# Patient Record
Sex: Female | Born: 1965 | Race: White | Hispanic: No | Marital: Single | Smoking: Former smoker
Health system: Southern US, Community
[De-identification: ages and names within clinical notes are randomized; demographics above are authoritative.]

## PROBLEM LIST (undated history)

## (undated) DIAGNOSIS — M1990 Unspecified osteoarthritis, unspecified site: Secondary | ICD-10-CM

## (undated) DIAGNOSIS — M069 Rheumatoid arthritis, unspecified: Secondary | ICD-10-CM

## (undated) DIAGNOSIS — L4050 Arthropathic psoriasis, unspecified: Secondary | ICD-10-CM

## (undated) DIAGNOSIS — I10 Essential (primary) hypertension: Secondary | ICD-10-CM

## (undated) DIAGNOSIS — U071 COVID-19: Secondary | ICD-10-CM

## (undated) DIAGNOSIS — E119 Type 2 diabetes mellitus without complications: Secondary | ICD-10-CM

## (undated) DIAGNOSIS — Z720 Tobacco use: Secondary | ICD-10-CM

## (undated) DIAGNOSIS — E785 Hyperlipidemia, unspecified: Secondary | ICD-10-CM

## (undated) HISTORY — DX: COVID-19: U07.1

## (undated) HISTORY — DX: Tobacco use: Z72.0

## (undated) HISTORY — PX: WISDOM TOOTH EXTRACTION: SHX21

## (undated) HISTORY — DX: Rheumatoid arthritis, unspecified: M06.9

## (undated) HISTORY — DX: Arthropathic psoriasis, unspecified: L40.50

## (undated) HISTORY — DX: Unspecified osteoarthritis, unspecified site: M19.90

## (undated) HISTORY — DX: Hyperlipidemia, unspecified: E78.5

## (undated) HISTORY — DX: Essential (primary) hypertension: I10

---

## 2004-09-05 ENCOUNTER — Ambulatory Visit: Payer: Self-pay | Admitting: Obstetrics and Gynecology

## 2007-01-21 ENCOUNTER — Ambulatory Visit: Payer: Self-pay | Admitting: Obstetrics and Gynecology

## 2007-01-26 ENCOUNTER — Ambulatory Visit: Payer: Self-pay | Admitting: Obstetrics and Gynecology

## 2008-02-08 ENCOUNTER — Ambulatory Visit: Payer: Self-pay | Admitting: Obstetrics and Gynecology

## 2009-02-08 ENCOUNTER — Ambulatory Visit: Payer: Self-pay | Admitting: Obstetrics and Gynecology

## 2009-02-16 ENCOUNTER — Ambulatory Visit: Payer: Self-pay | Admitting: Obstetrics and Gynecology

## 2010-02-19 ENCOUNTER — Ambulatory Visit: Payer: Self-pay | Admitting: Obstetrics and Gynecology

## 2010-12-11 ENCOUNTER — Encounter: Payer: Self-pay | Admitting: Family Medicine

## 2010-12-11 ENCOUNTER — Ambulatory Visit (INDEPENDENT_AMBULATORY_CARE_PROVIDER_SITE_OTHER): Payer: 59 | Admitting: Family Medicine

## 2010-12-11 DIAGNOSIS — R5383 Other fatigue: Secondary | ICD-10-CM

## 2010-12-11 DIAGNOSIS — Z136 Encounter for screening for cardiovascular disorders: Secondary | ICD-10-CM

## 2010-12-11 DIAGNOSIS — Z0000 Encounter for general adult medical examination without abnormal findings: Secondary | ICD-10-CM | POA: Insufficient documentation

## 2010-12-11 NOTE — Progress Notes (Signed)
45 yo female here to establish care.  Tobacco abuse- cut back to 8-10 cigs per day.  Heavy smoker, has smoked 1-2 ppd since she was a teenager. Quit once two years ago with Chantix for 3 months. Has rx to restart chantix at home, not quite ready.  G0, engaged to be married. Has OBGYN, Dr. Madelin Headings.  At times feels a little fatigued. No CP or SOB. Recently started walking more, trying to get in shape with her fiance.  The PMH, PSH, Social History, Family History, Medications, and allergies have been reviewed in [facility], and have been updated if relevant.  ROS: General: Denies fever, chills, sweats. No significant weight loss. Eyes: Denies blurring,significant itching ENT: Denies earache, sore throat, and hoarseness.  Cardiovascular: Denies chest pains, palpitations, dyspnea on exertion,  Respiratory: Denies cough, dyspnea at rest,wheeezing Breast: no concerns about lumps GI: Denies nausea, vomiting, diarrhea, constipation, change in bowel habits, abdominal pain, melena, hematochezia GU: Denies dysuria, hematuria, urinary hesitancy, nocturia, denies STD risk, no concerns about discharge Musculoskeletal: Denies back pain, joint pain Derm: Denies rash, itching Neuro: Denies  paresthesias, frequent falls, frequent headaches Psych: Denies depression, anxiety Endocrine: Denies cold intolerance, heat intolerance, polydipsia Heme: Denies enlarged lymph nodes Allergy: No hayfever  Physical exam: BP 110/80  Pulse 72  Temp(Src) 98.6 F (37 C) (Oral)  Ht 5\' 7"  (1.702 m)  Wt 199 lb 6.4 oz (90.447 kg)  BMI 31.23 kg/m2  LMP 11/20/2010  General:  Well-developed,well-nourished,in no acute distress; alert,appropriate and cooperative throughout examination Head:  normocephalic and atraumatic.   Eyes:  vision grossly intact, pupils equal, pupils round, and pupils reactive to light.   Ears:  R ear normal and L ear normal.   Nose:  no external deformity.   Mouth:  good dentition.   Neck:  No  deformities, masses, or tenderness noted. Lungs:  Normal respiratory effort, chest expands symmetrically. Lungs are clear to auscultation, no crackles or wheezes. Heart:  Normal rate and regular rhythm. S1 and S2 normal without gallop, murmur, click, rub or other extra sounds. Abdomen:  Bowel sounds positive,abdomen soft and non-tender without masses, organomegaly or hernias noted. Msk:  No deformity or scoliosis noted of thoracic or lumbar spine.   Extremities:  No clubbing, cyanosis, edema, or deformity noted with normal full range of motion of all joints.   Neurologic:  alert & oriented X3 and gait normal.   Skin:  Intact without suspicious lesions or rashes Cervical Nodes:  No lymphadenopathy noted Axillary Nodes:  No palpable lymphadenopathy Psych:  Cognition and judgment appear intact. Alert and cooperative with normal attention span and concentration. No apparent delusions, illusions, hallucinations

## 2010-12-11 NOTE — Assessment & Plan Note (Signed)
Reviewed preventive care protocols, scheduled due services, and updated immunizations Discussed nutrition, exercise, diet, and healthy lifestyle.  Orders Placed This Encounter  Procedures  . Basic Metabolic Panel (BMET)  . Lipid panel  . CBC w/Diff  . TSH

## 2010-12-18 ENCOUNTER — Encounter: Payer: Self-pay | Admitting: Family Medicine

## 2011-04-16 ENCOUNTER — Ambulatory Visit: Payer: Self-pay | Admitting: Obstetrics and Gynecology

## 2012-04-20 ENCOUNTER — Ambulatory Visit: Payer: Self-pay | Admitting: Obstetrics and Gynecology

## 2012-08-19 ENCOUNTER — Ambulatory Visit (INDEPENDENT_AMBULATORY_CARE_PROVIDER_SITE_OTHER): Payer: 59 | Admitting: Family Medicine

## 2012-08-19 ENCOUNTER — Encounter: Payer: Self-pay | Admitting: Family Medicine

## 2012-08-19 VITALS — BP 120/72 | HR 85 | Temp 98.9°F | Ht 67.0 in | Wt 189.5 lb

## 2012-08-19 DIAGNOSIS — R209 Unspecified disturbances of skin sensation: Secondary | ICD-10-CM

## 2012-08-19 DIAGNOSIS — M2550 Pain in unspecified joint: Secondary | ICD-10-CM

## 2012-08-19 DIAGNOSIS — M79609 Pain in unspecified limb: Secondary | ICD-10-CM

## 2012-08-19 DIAGNOSIS — M659 Unspecified synovitis and tenosynovitis, unspecified site: Secondary | ICD-10-CM

## 2012-08-19 DIAGNOSIS — R202 Paresthesia of skin: Secondary | ICD-10-CM

## 2012-08-19 DIAGNOSIS — M79605 Pain in left leg: Secondary | ICD-10-CM

## 2012-08-19 NOTE — Patient Instructions (Addendum)
REFERRAL: GO THE THE FRONT ROOM AT THE ENTRANCE OF OUR CLINIC, NEAR CHECK IN. ASK FOR MARION. SHE WILL HELP YOU SET UP YOUR REFERRAL. DATE: TIME:  

## 2012-08-19 NOTE — Progress Notes (Signed)
Nature conservation officer at [facility] [address] [place] [address] Phone: [phone] Fax: [phone]  Date:  08/19/2012   Name:  Regina Barnes   DOB:  02-21-1966   MRN:  [id number] Gender: female Age: 46 y.o.  PCP:  Ruthe Mannan, MD  Evaluating MD: Hannah Beat, MD   Chief Complaint: Arm Pain and Leg Pain   History of Present Illness:  Regina Barnes is a 46 y.o. pleasant patient who presents with the following:  Generally healthy 46 year old patient, female, who presents with 3 weeks of worsening multiple joint polyarthralgia, some swelling, and pain in her hands bilaterally, neck pain, knee pain as well as generalized achiness, more in her legs. She's also had some swelling in her LEFT knee and pain and fullness there anteriorly and posteriorly.  Within the last few days she's also developed some pain in her RIGHT shoulder as well.  She also has some 1st MCP mild redness and swelling and tenderness that is all new for her.  All this has been ongoing for 3 weeks, and she initially went to her chiropractor, Dr. Chriss Czar, and no manipulation or chiropractic intervention as helped at all. She has had some neck pain however. He is scheduled to see her on Monday with x-rays prior.  She initially went to the walk-in clinic at [facility], and was given a five-day prednisone taper as well as tramadol. This helped a little bit, but she remains symptomatic and is actually been worsening over time significantly. She is here with her fianc who is quite concerned.  There is no history of trauma or accident.  08/06/2012 - also felt something on the lateral aspect of her right leg, and then the next day, felt ok, felt some achiness in her office in her legs. Yesterday, had some achiness in the right leg, and then about six at night, then had sme left knee pain. Very tender and the knee feels swollen.   Now having pain in the right side of her shoulder.   No Rheum Fam History.     Patient Active Problem List  Diagnosis  . Routine general medical examination at a health care facility    Past Medical History  Diagnosis Date  . Tobacco abuse     No past surgical history on file.  History  Substance Use Topics  . Smoking status: Current Every Day Smoker  . Smokeless tobacco: Not on file  . Alcohol Use: Not on file    Family History  Problem Relation Age of Onset  . Heart attack Father 27    No Known Allergies  Medication list has been reviewed and updated.  No outpatient prescriptions prior to visit.    Last reviewed on 08/19/2012  3:13 PM by Consuello Masse, CMA  Review of Systems:  No fever, chills, sweats, nausea system, chest pain, shortness of breath. No headache. No blurred vision. No neurological signs or symptoms or changes. Multiple joint pains and complaints as above.  Physical Examination: Filed Vitals:   08/19/12 1426  BP: 120/72  Pulse: 85  Temp: 98.9 F (37.2 C)  TempSrc: Oral  Height: 5\' 7"  (1.702 m)  Weight: 189 lb 8 oz (85.957 kg)  SpO2: 97%    Body mass index is 29.68 kg/(m^2). Ideal Body Weight: Weight in (lb) to have BMI = 25: 159.3    GEN: WDWN, NAD, Non-toxic, Alert & Oriented x 3 HEENT: Atraumatic, Normocephalic.  Ears and Nose: No external deformity. EXTR: No clubbing/cyanosis/edema  NEURO: moves very slowly and stands very slowly from a seated position. There is no focal weakness and no proximal weakness whatsoever. Strength testing is 5/5 on examination. PSYCH: Normally interactive. Conversant. Not depressed or anxious appearing.  Calm demeanor.   Neck range of motion is mildly restricted with an approximate 15% loss of motion in multiple directions. There is no inducible Spurling's maneuver. Paracervical musculature is tender.  Bilateral slow movement of shoulders with full range of motion. Minimally tender to palpation at the a.c. Joint. Nontender at the clavicle. Mildly tender in the bicipital groove.  Strength testing is 5/5. A.c. Crossovers minimally positive. Leanord Asal testing and Neer testing are positive. Speech test is positive. Yergason's test is negative. No significant grinding.  Bilateral hands with no significant swelling or bogginess in the PIP or DIP joints. MCP joint on the 1st digit is swollen, tender and reddish, more on the LEFT.  Axial loading is nontender, but there is some bogginess in the true wrist joints bilaterally.  Hip range of motion is excellent and full bilaterally.  LEFT knee with a minimal to mild effusion. Slightly greater so compared to the RIGHT. Full extension. Flexion to 115. Apprehensive with manipulation of the knee. Stable to varus and valgus stress. Negative Lachman maneuver. Negative posterior drawer testing. Bounce home testing is painful. No mechanical symptoms with McMurray's testing, but it is painful. The patient does guard somewhat.  Homen's positive. Tender with posterior palpation and squeezing of the calf musculature on L.  Objective Data: All verbally reviewed with the patient on the telephone by myself. Hannah Beat, MD   Results for orders placed in visit on 08/19/12  HIGH SENSITIVITY CRP      Component Value Range   CRP, High Sensitivity 79.79 (*) 0.00 - 3.00 mg/L  SEDIMENTATION RATE      Component Value Range   Sed Rate 17  0 - 32 mm/hr  ANA      Component Value Range   ANA Positive (*) Negative  RHEUMATOID FACTOR      Component Value Range   Rheumatoid Factor 18.4 (*) 0.0 - 13.9 IU/mL  CYCLIC CITRUL PEPTIDE ANTIBODY, IGG      Component Value Range   Cyclic Citrullin Peptide Ab 5.7 (*) 0.0 - 5.0 U/mL  BASIC METABOLIC PANEL      Component Value Range   Glucose 116 (*) 65 - 99 mg/dL   BUN 12  6 - 24 mg/dL   Creatinine, Ser 1.61  0.57 - 1.00 mg/dL   GFR calc non Af Amer 91  >59 mL/min/1.73   GFR calc Af Amer 105  >59 mL/min/1.73   BUN/Creatinine Ratio 15  9 - 23   Sodium 138  134 - 144 mmol/L   Potassium 4.1  3.5  - 5.2 mmol/L   Chloride 100  97 - 108 mmol/L   CO2 22  19 - 28 mmol/L   Calcium 9.4  8.7 - 10.2 mg/dL  CBC WITH DIFFERENTIAL      Component Value Range   WBC 16.4 (*) 3.4 - 10.8 x10E3/uL   RBC 4.34  3.77 - 5.28 x10E6/uL   Hemoglobin 13.2  11.1 - 15.9 g/dL   HCT 09.6  04.5 - 40.9 %   MCV 90  79 - 97 fL   MCH 30.4  26.6 - 33.0 pg   MCHC 33.9  31.5 - 35.7 g/dL   RDW 81.1  91.4 - 78.2 %   Neutrophils Relative 70  40 - 74 %  Lymphs 21  14 - 46 %   Monocytes 8  4 - 12 %   Eos 1  0 - 5 %   Basos 0  0 - 3 %   Neutrophils Absolute 11.5 (*) 1.4 - 7.0 x10E3/uL   Lymphocytes Absolute 3.4 (*) 0.7 - 3.1 x10E3/uL   Monocytes Absolute 1.3 (*) 0.1 - 0.9 x10E3/uL   Eosinophils Absolute 0.1  0.0 - 0.4 x10E3/uL   Basophils Absolute 0.0  0.0 - 0.2 x10E3/uL   Immature Granulocytes 0  0 - 2 %   Immature Grans (Abs) 0.0  0.0 - 0.1 x10E3/uL  HEPATIC FUNCTION PANEL      Component Value Range   Total Protein 6.3  6.0 - 8.5 g/dL   Albumin 4.1  3.5 - 5.5 g/dL   Total Bilirubin 0.4  0.0 - 1.2 mg/dL   Bilirubin, Direct 4.09  0.00 - 0.40 mg/dL   Alkaline Phosphatase 93  39 - 117 IU/L   AST 11  0 - 40 IU/L   ALT 16  0 - 32 IU/L  VITAMIN B12      Component Value Range   Vitamin B-12 456  211 - 946 pg/mL    US Venous Img Lower Unilateral Left  08/20/2012  *RADIOLOGY REPORT*  Clinical Data: History of left leg pain.  LEFT LOWER EXTREMITY VENOUS DUPLEX ULTRASOUND  Technique:  Gray-scale sonography with graded compression, as well as color Doppler and duplex ultrasound were performed to evaluate the deep venous system of the lower extremity from the level of the common femoral vein through the popliteal and proximal calf veins. Spectral Doppler was utilized to evaluate flow at rest and with distal augmentation maneuvers.  Comparison:  None.  Findings:  Normal compressibility of the common femoral, superficial femoral, and popliteal veins is demonstrated, as well as the visualized proximal calf veins.  No filling  defects to suggest DVT on grayscale or color Doppler imaging.  Doppler waveforms show normal direction of venous flow, normal respiratory phasicity and response to augmentation.  No superficial thrombophlebitis was identified.  Multiple varicosities are present.  There is some reflux at the saphenous femoral junction.  Popliteal fluid collection consistent with popliteal synovial Baker's cyst is seen.  This measures 4.9 x 2.9 x 1.3 cm.  IMPRESSION: No evidence of deep venous thrombosis involving the left leg.  Multiple superficial varicosities are present.  No superficial phlebothrombosis was evident.  Popliteal fluid collection consistent with popliteal synovial Baker's cyst is seen.  This measures 4.9 x 2.9 x 1.3 cm.   Original Report Authenticated By: Onalee Hua Call     Assessment and Plan:  1. Polyarthralgia  High sensitivity CRP, Sedimentation rate, ANA, Rheumatoid factor, Cyclic citrul peptide antibody, IgG, Basic metabolic panel, CBC with Differential, Hepatic function panel, Ambulatory referral to Rheumatology  2. Left leg pain  US Venous Img Lower Unilateral Left, Ambulatory referral to Rheumatology  3. Tingling in extremities  Basic metabolic panel, CBC with Differential, Hepatic function panel, Vitamin B12  4. Synovitis  Ambulatory referral to Rheumatology   >40 minutes spent in face to face time with patient, >50% spent in counselling or coordination of care: history and examination are not consistent with occult orthopedic pathology.  Significant left-sided posterior knee pain and pain with palpation and squeezing of the calf musculature. Obtain a Doppler ultrasound to rule out potential deep vein thrombosis. Ultrasound has returned and is negative. There is a Baker's cyst which may or may not be contributing to the patient's  knee pain. Discussed with her that these can be associated with meniscal pathology, but taking care of probable rheumatological condition 1st with reassessment makes  greatest sense.  Elevated CRP at 80. Positive rheumatoid factor, positive CCP anti bodies and positive ANA. Given clinical picture and laboratory picture and polyarthralgia with some synovitis and effusion, high level of clinical concern for rheumatological disease and active rheumatological flare.  All discussed with patient, and will consult Dr. Lavenia Atlas for his expertise. Appreciate consult.  Orders Today:  Orders Placed This Encounter  Procedures  . US Venous Img Lower Unilateral Left    Standing Status: Future     Number of Occurrences: 1     Standing Expiration Date: 10/19/2013    ATTN LEFT LEG**WT-189/NO PREV/NO NEEDS PER OFFICE/EPIC ORDER/[facility]    Order Specific Question:  Reason for exam:    Answer:  left posterior LE pain, eval for DVT    Order Specific Question:  Preferred imaging location?    Answer:  [facility]  . High sensitivity CRP  . Sedimentation rate  . ANA  . Rheumatoid factor  . Cyclic citrul peptide antibody, IgG  . Basic metabolic panel  . CBC with Differential  . Hepatic function panel  . Vitamin B12  . Ambulatory referral to Rheumatology    Referral Priority:  Routine    Referral Type:  Consultation    Referral Reason:  Specialty Services Required    Requested Specialty:  Rheumatology    Number of Visits Requested:  1    Updated Medication List: (Includes new medications, updates to list, dose adjustments) Meds ordered this encounter  Medications  . traMADol (ULTRAM) 50 MG tablet    Sig: Take 50 mg by mouth every 8 (eight) hours as needed.     Medications Discontinued: There are no discontinued medications.   Hannah Beat, MD

## 2012-08-20 ENCOUNTER — Ambulatory Visit
Admission: RE | Admit: 2012-08-20 | Discharge: 2012-08-20 | Disposition: A | Discharge status: Home / Self Care | Payer: 59 | Source: Ambulatory Visit | Attending: Family Medicine | Admitting: Family Medicine

## 2012-08-20 DIAGNOSIS — M79605 Pain in left leg: Secondary | ICD-10-CM

## 2012-08-20 LAB — CBC WITH DIFFERENTIAL/PLATELET
Basos: 0 % (ref 0–3)
Eosinophils Absolute: 0.1 10*3/uL (ref 0.0–0.4)
Immature Grans (Abs): 0 10*3/uL (ref 0.0–0.1)
Immature Granulocytes: 0 % (ref 0–2)
Lymphs: 21 % (ref 14–46)
MCH: 30.4 pg (ref 26.6–33.0)
MCV: 90 fL (ref 79–97)
Monocytes Absolute: 1.3 10*3/uL — ABNORMAL HIGH (ref 0.1–0.9)
Neutrophils Relative %: 70 % (ref 40–74)
RBC: 4.34 x10E6/uL (ref 3.77–5.28)
RDW: 12.9 % (ref 12.3–15.4)
WBC: 16.4 10*3/uL — ABNORMAL HIGH (ref 3.4–10.8)

## 2012-08-20 LAB — HEPATIC FUNCTION PANEL
ALT: 16 IU/L (ref 0–32)
Total Protein: 6.3 g/dL (ref 6.0–8.5)

## 2012-08-20 LAB — BASIC METABOLIC PANEL
BUN: 12 mg/dL (ref 6–24)
CO2: 22 mmol/L (ref 19–28)
Chloride: 100 mmol/L (ref 97–108)
GFR calc Af Amer: 105 mL/min/{1.73_m2} (ref >59)
Glucose: 116 mg/dL — ABNORMAL HIGH (ref 65–99)
Potassium: 4.1 mmol/L (ref 3.5–5.2)

## 2012-08-20 LAB — RHEUMATOID FACTOR: Rhuematoid fact SerPl-aCnc: 18.4 IU/mL — ABNORMAL HIGH (ref 0.0–13.9)

## 2012-08-20 LAB — HIGH SENSITIVITY CRP: CRP, High Sensitivity: 79.79 mg/L — ABNORMAL HIGH (ref 0.00–3.00)

## 2012-08-20 LAB — VITAMIN B12: Vitamin B-12: 456 pg/mL (ref 211–946)

## 2012-09-14 ENCOUNTER — Telehealth: Payer: Self-pay | Admitting: Family Medicine

## 2012-09-14 DIAGNOSIS — R768 Other specified abnormal immunological findings in serum: Secondary | ICD-10-CM

## 2012-09-14 NOTE — Telephone Encounter (Signed)
Dr. Patsy Lager saw this pt last week.  She would like referral to [facility] to see a rheumatologist.  She would like to see Dr Dareen Piano or Dr Dierdre Forth.  Office # [phone] fax# [phone].

## 2012-09-14 NOTE — Telephone Encounter (Signed)
Referral placed.

## 2012-09-14 NOTE — Telephone Encounter (Signed)
Advised patient

## 2012-12-01 ENCOUNTER — Encounter: Payer: Self-pay | Admitting: Family Medicine

## 2012-12-01 ENCOUNTER — Ambulatory Visit (INDEPENDENT_AMBULATORY_CARE_PROVIDER_SITE_OTHER): Payer: 59 | Admitting: Family Medicine

## 2012-12-01 VITALS — BP 102/76 | HR 68 | Temp 97.9°F | Wt 191.8 lb

## 2012-12-01 DIAGNOSIS — M2550 Pain in unspecified joint: Secondary | ICD-10-CM

## 2012-12-01 NOTE — Addendum Note (Signed)
Addended by: Alvina Chou on: 12/01/2012 10:42 AM   Modules accepted: Orders

## 2012-12-01 NOTE — Progress Notes (Signed)
  Subjective:    Patient ID: Regina Barnes, female    DOB: [birth year], 47 y.o.   MRN: [id number]  HPI  Very pleasant 47 yo female here to discuss her inflammatory arthritis.  Seeing Dr. Dareen Piano, most recently saw him yesterday.  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  On 11/02/2012, started sulindac 150 mg daily.  No real improvement.  This was d/c'd on 11/15/2012 and prednisone 10 mg daily was started. Symptoms have improved a little- some days better than others.  She was advised to stop her prednisone today and start Celebrex this evening. He wants her to follow up with him in 1 week concerning how celebrex is working.   If it it ineffective, he would like for her to consider MTX.  Most of pain is in her bilateral arms and shoulders.  Both her grandfather and aunt had psoraisis.  She has no skin lesions.  Patient Active Problem List  Diagnosis  . Routine general medical examination at a health care facility  . Polyarthralgia   Past Medical History  Diagnosis Date  . Tobacco abuse    No past surgical history on file. History  Substance Use Topics  . Smoking status: Former Games developer  . Smokeless tobacco: Not on file  . Alcohol Use: Not on file   Family History  Problem Relation Age of Onset  . Heart attack Father 32   Allergies  Allergen Reactions  . Codeine Rash   No current outpatient prescriptions on file prior to visit.   No current facility-administered medications on file prior to visit.   The PMH, PSH, Social History, Family History, Medications, and allergies have been reviewed in [facility], and have been updated if relevant.   Review of Systems See HPI    Objective:   Physical Exam BP 102/76  Pulse 68  Temp(Src) 97.9 F (36.6 C) (Oral)  Wt 191 lb 12 oz (86.977 kg)  BMI 30.03 kg/m2  SpO2 96%  LMP 11/17/2012  General:  Well-developed,well-nourished,in no acute distress; alert,appropriate and cooperative throughout examination Head:   normocephalic and atraumatic.   Lungs:  Normal respiratory effort, chest expands symmetrically. Lungs are clear to auscultation, no crackles or wheezes. Heart:  Normal rate and regular rhythm. S1 and S2 normal without gallop, murmur, click, rub or other extra sounds. Abdomen:  Bowel sounds positive,abdomen soft and non-tender without masses, organomegaly or hernias noted. Msk:  No deformity or scoliosis noted of thoracic or lumbar spine.   Extremities:  No clubbing, cyanosis, edema, or deformity noted with normal full range of motion of all joints.   Neurologic:  alert & oriented X3 and gait normal.   Skin:  Intact without suspicious lesions or rashes Psych:  Cognition and judgment appear intact. Alert and cooperative with normal attention span and concentration. No apparent delusions, illusions, hallucinations     Assessment & Plan:  1. Polyarthralgia Inflammatory arthritis. >25 min spent with face to face with patient, >50% counseling and/or coordinating care  She would like to be tested for Lyme disease as she read that it can mimic inflammatory arthritis.  No known tick bite or travel to [place].   - B. Burgdorfi Antibodies - Comprehensive metabolic panel

## 2012-12-01 NOTE — Patient Instructions (Addendum)
It was great to see you, Regina Barnes. We will call you with your lab results.  Keep me posted and hang in there.

## 2012-12-02 LAB — COMPREHENSIVE METABOLIC PANEL
Albumin/Globulin Ratio: 2.2 (ref 1.1–2.5)
Albumin: 4.3 g/dL (ref 3.5–5.5)
Alkaline Phosphatase: 95 IU/L (ref 39–117)
BUN/Creatinine Ratio: 14 (ref 9–23)
BUN: 12 mg/dL (ref 6–24)
CO2: 25 mmol/L (ref 19–28)
Calcium: 9.7 mg/dL (ref 8.7–10.2)
Creatinine, Ser: 0.83 mg/dL (ref 0.57–1.00)
GFR calc non Af Amer: 85 mL/min/{1.73_m2} (ref >59)
Globulin, Total: 2 g/dL (ref 1.5–4.5)
Sodium: 141 mmol/L (ref 134–144)

## 2012-12-03 ENCOUNTER — Encounter: Payer: Self-pay | Admitting: *Deleted

## 2013-03-01 ENCOUNTER — Encounter: Payer: Self-pay | Admitting: Family Medicine

## 2013-03-01 ENCOUNTER — Ambulatory Visit (INDEPENDENT_AMBULATORY_CARE_PROVIDER_SITE_OTHER): Payer: 59 | Admitting: Family Medicine

## 2013-03-01 VITALS — BP 92/64 | HR 88 | Temp 98.1°F | Wt 200.0 lb

## 2013-03-01 DIAGNOSIS — M2550 Pain in unspecified joint: Secondary | ICD-10-CM

## 2013-03-01 NOTE — Progress Notes (Signed)
  Subjective:    Patient ID: Regina Barnes, female    DOB: 1965/11/09, 47 y.o.   MRN: [id number]  HPI  Very pleasant 47 yo female here to discuss her inflammatory arthritis.  Seeing Dr. Dareen Piano, most recently saw him 02/10/2013.  He feels she likely has ankylosing spondylitis.  Currently on Methotrexate- started on May 7th.  Has follow up with Dr. Dareen Piano on 05/12/2013.  Most of pain is in her hands and wrists, bilateral.  Feels better.  CBC two weeks ago normal.  Patient Active Problem List   Diagnosis Date Noted  . Polyarthralgia 12/01/2012  . Routine general medical examination at a health care facility 12/11/2010   Past Medical History  Diagnosis Date  . Tobacco abuse    No past surgical history on file. History  Substance Use Topics  . Smoking status: Former Games developer  . Smokeless tobacco: Not on file  . Alcohol Use: Not on file   Family History  Problem Relation Age of Onset  . Heart attack Father 67   Allergies  Allergen Reactions  . Codeine Rash   No current outpatient prescriptions on file prior to visit.   No current facility-administered medications on file prior to visit.   The PMH, PSH, Social History, Family History, Medications, and allergies have been reviewed in [facility], and have been updated if relevant.   Review of Systems See HPI    Objective:   Physical Exam BP 92/64  Pulse 88  Temp(Src) 98.1 F (36.7 C)  Wt 200 lb (90.719 kg)  BMI 31.32 kg/m2  General:  Well-developed,well-nourished,in no acute distress; alert,appropriate and cooperative throughout examination Head:  normocephalic and atraumatic.   Lungs:  Normal respiratory effort, chest expands symmetrically. Lungs are clear to auscultation, no crackles or wheezes. Heart:  Normal rate and regular rhythm. S1 and S2 normal without gallop, murmur, click, rub or other extra sounds. Abdomen:  Bowel sounds positive,abdomen soft and non-tender without masses, organomegaly or hernias noted. Msk:   No deformity or scoliosis noted of thoracic or lumbar spine.   Extremities:  No clubbing, cyanosis, edema, or deformity noted with normal full range of motion of all joints.   Neurologic:  alert & oriented X3 and gait normal.   Skin:  Intact without suspicious lesions or rashes Psych:  Cognition and judgment appear intact. Alert and cooperative with normal attention span and concentration. No apparent delusions, illusions, hallucinations     Assessment & Plan:  1. Polyarthralgia >15 min spent with face to face with patient, >50% counseling and/or coordinating care. She had some questions about her condition and MTX which we discussed today. She will keep f/u scheduled with Dr. Dareen Piano in 04/2013.

## 2013-05-04 ENCOUNTER — Ambulatory Visit: Payer: Self-pay | Admitting: Obstetrics and Gynecology

## 2014-07-10 IMAGING — US US EXTREM LOW VENOUS*L*
1 series · 13 of 24 positions shown · non-contrast
Comparison: None.

CLINICAL DATA: History of left leg pain.

LEFT LOWER EXTREMITY VENOUS DUPLEX ULTRASOUND
TECHNIQUE: Gray-scale sonography with graded compression, as well
as color Doppler and duplex ultrasound were performed to evaluate
the deep venous system of the lower extremity from the level of the
common femoral vein through the popliteal and proximal calf veins.
Spectral Doppler was utilized to evaluate flow at rest and with
distal augmentation maneuvers.

[Series 1: us extrem low venous*left* · 13 of 37 slices shown]
[im 1/37]
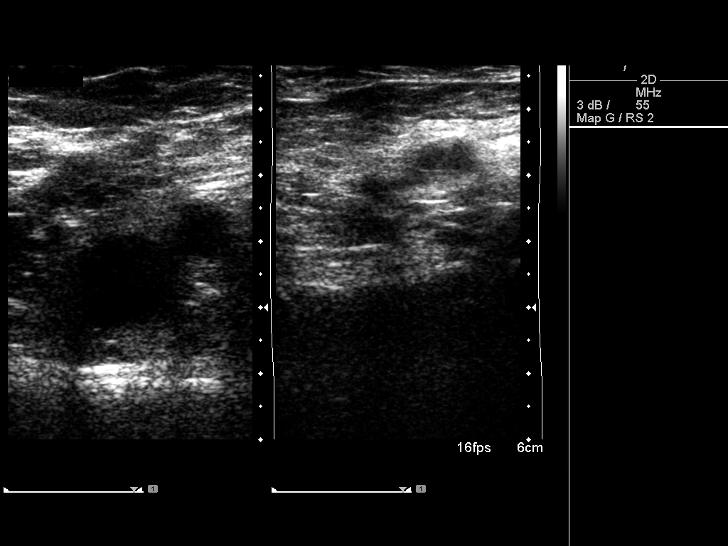
[im 4/37]
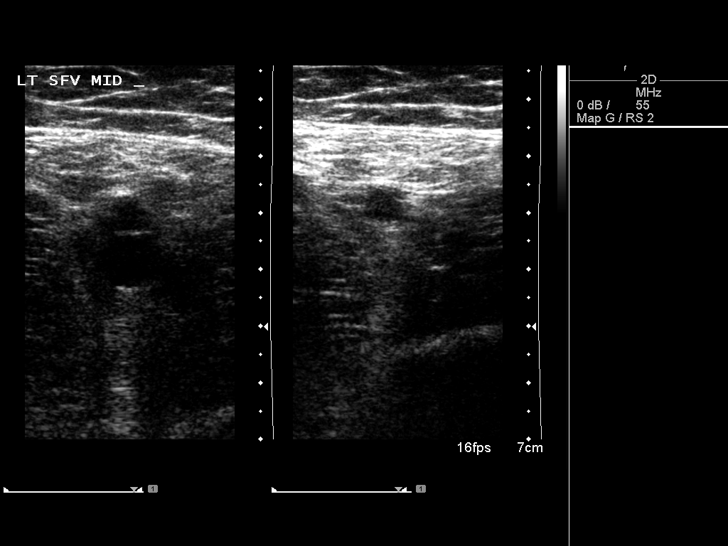
[im 7/37]
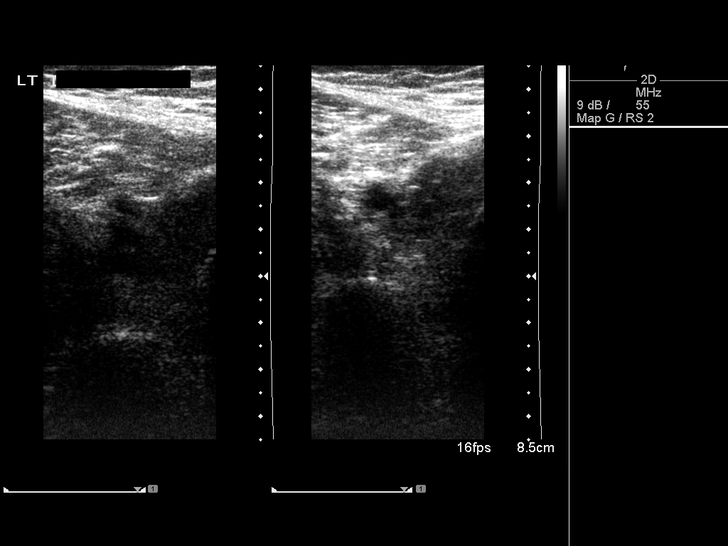
[im 10/37]
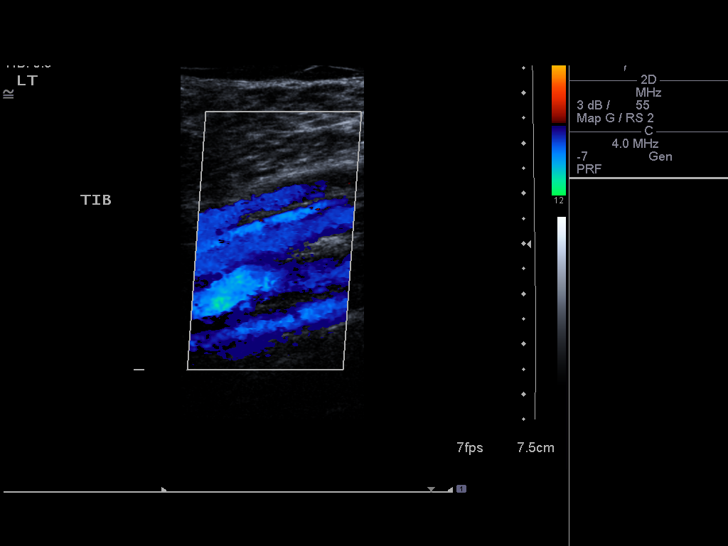
[im 13/37]
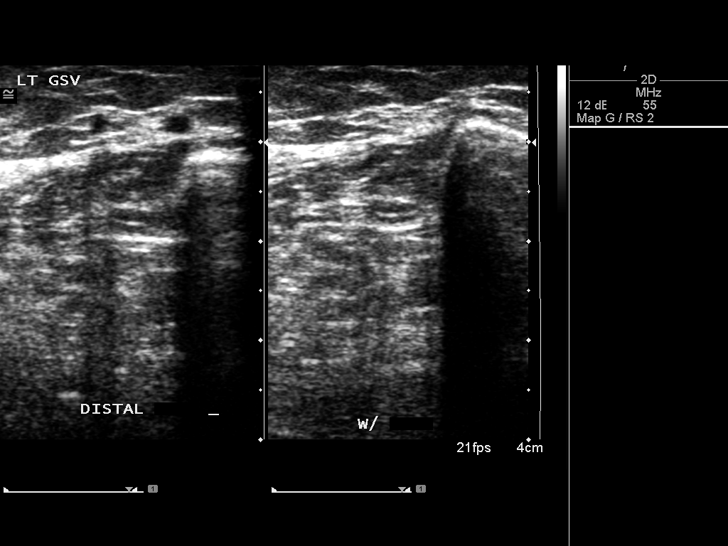
[im 16/37]
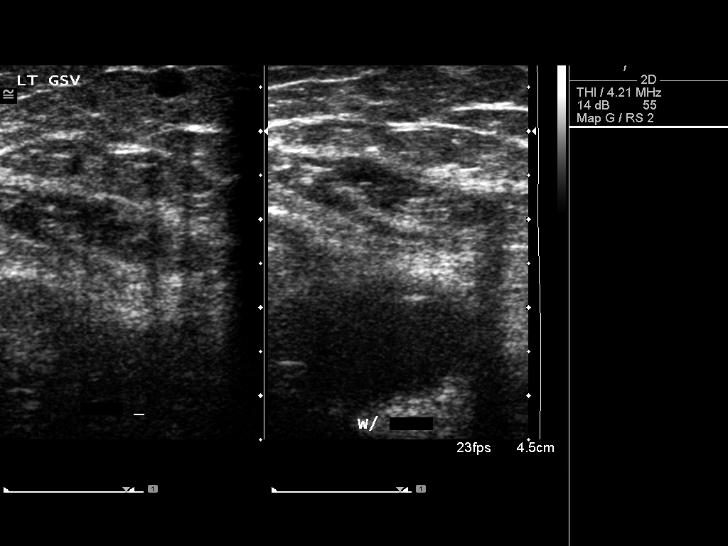
[im 19/37]
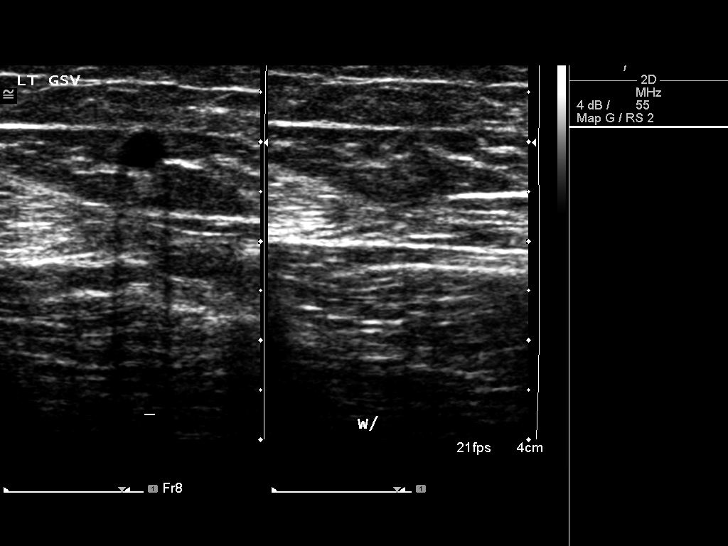
[im 21/37]
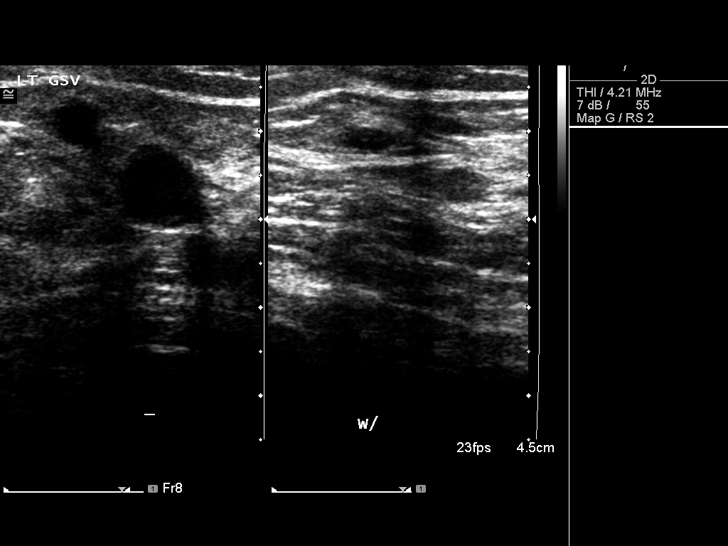
[im 24/37]
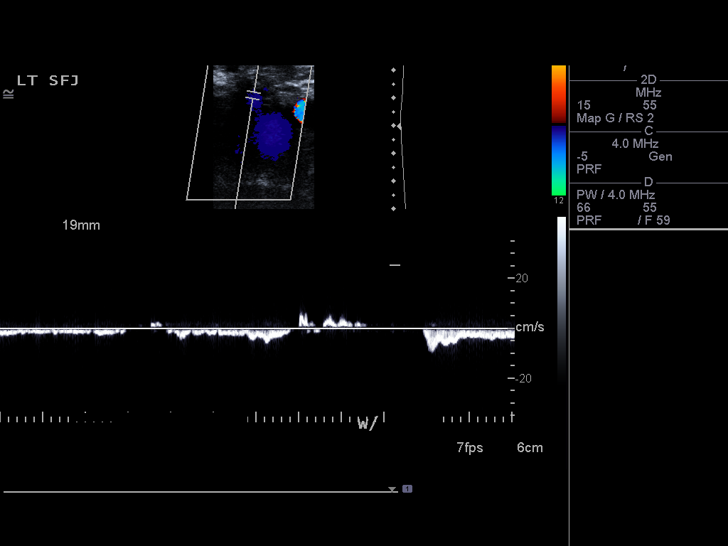
[im 27/37]
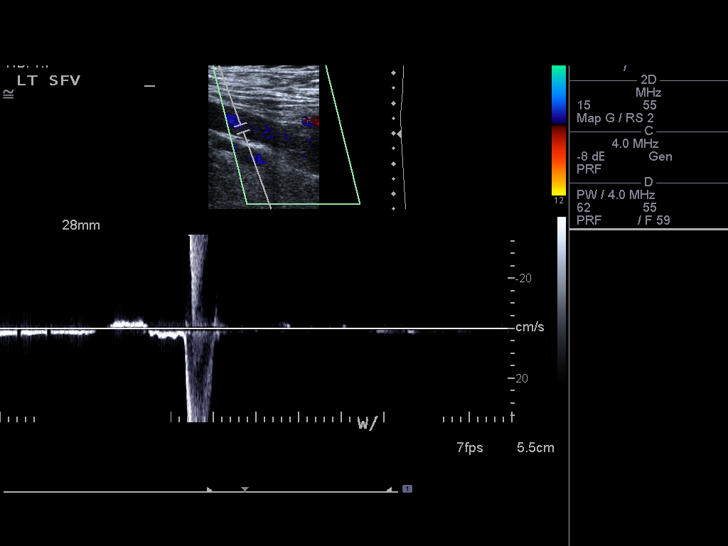
[im 30/37]
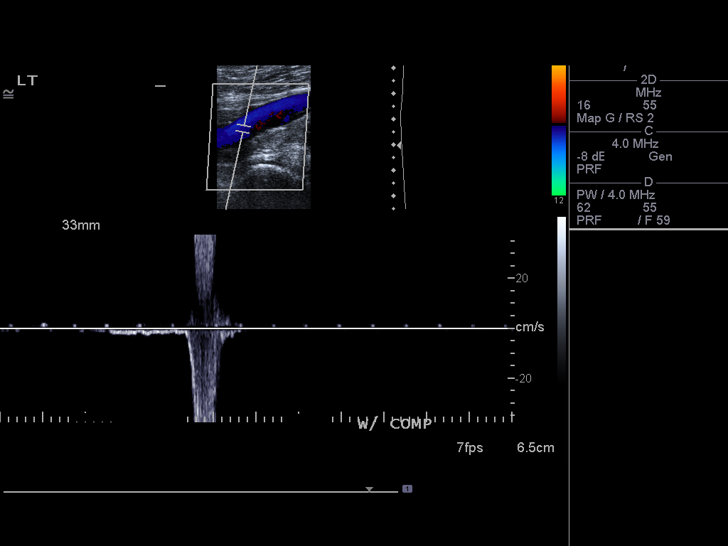
[im 33/37]
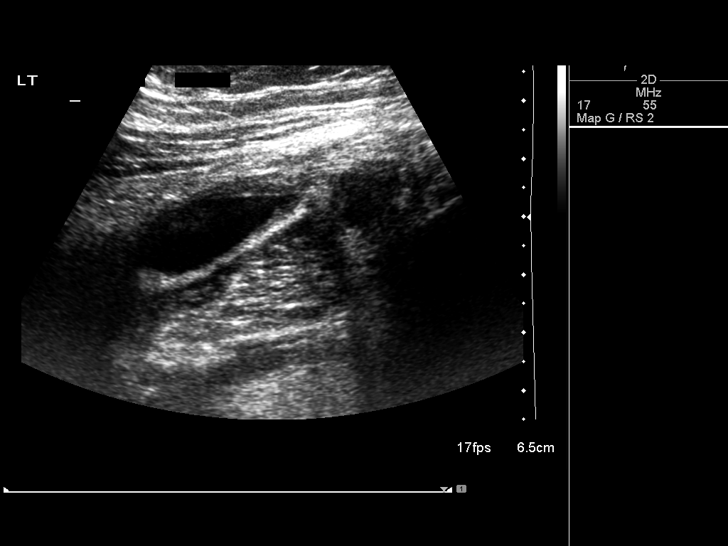
[im 37/37]
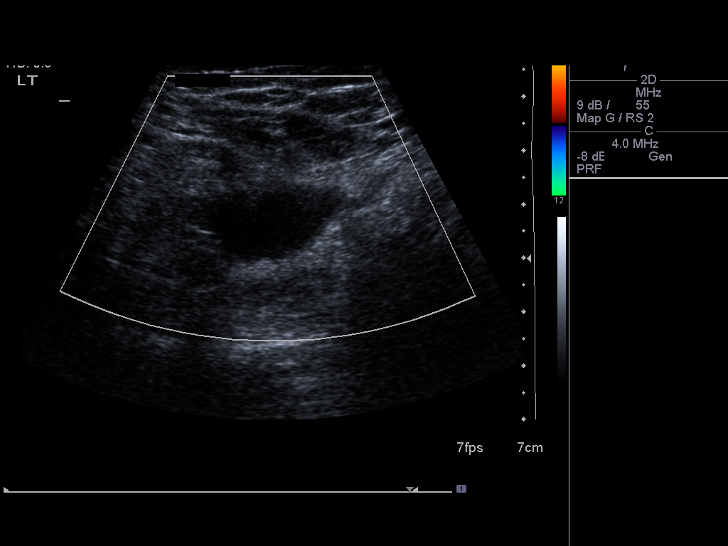

[13 of 24 positions shown; findings below may reference images not displayed]

FINDINGS: Normal compressibility of the common femoral,
superficial femoral, and popliteal veins is demonstrated, as well
as the visualized proximal calf veins.  No filling defects to
suggest DVT on grayscale or color Doppler imaging.  Doppler
waveforms show normal direction of venous flow, normal respiratory
phasicity and response to augmentation.

No superficial thrombophlebitis was identified.  Multiple
varicosities are present.  There is some reflux at the saphenous
femoral junction.  Popliteal fluid collection consistent with
popliteal synovial Baker's cyst is seen.  This measures 4.9 x 2.9 x
1.3 cm.
IMPRESSION: No evidence of deep venous thrombosis involving the left leg.

Multiple superficial varicosities are present.  No superficial
phlebothrombosis was evident.

Popliteal fluid collection consistent with popliteal synovial
Baker's cyst is seen.  This measures 4.9 x 2.9 x 1.3 cm.

## 2015-05-14 ENCOUNTER — Other Ambulatory Visit: Payer: Self-pay | Admitting: Obstetrics and Gynecology

## 2015-05-14 DIAGNOSIS — Z1231 Encounter for screening mammogram for malignant neoplasm of breast: Secondary | ICD-10-CM

## 2015-05-18 ENCOUNTER — Ambulatory Visit
Admission: RE | Admit: 2015-05-18 | Discharge: 2015-05-18 | Disposition: A | Discharge status: Home / Self Care | Payer: 59 | Source: Ambulatory Visit | Attending: Obstetrics and Gynecology | Admitting: Obstetrics and Gynecology

## 2015-05-18 DIAGNOSIS — Z1231 Encounter for screening mammogram for malignant neoplasm of breast: Secondary | ICD-10-CM | POA: Diagnosis not present

## 2015-06-12 ENCOUNTER — Ambulatory Visit (INDEPENDENT_AMBULATORY_CARE_PROVIDER_SITE_OTHER): Payer: 59 | Admitting: Family Medicine

## 2015-06-12 ENCOUNTER — Encounter: Payer: Self-pay | Admitting: Family Medicine

## 2015-06-12 VITALS — BP 104/80 | HR 71 | Temp 98.1°F | Wt 193.0 lb

## 2015-06-12 DIAGNOSIS — E119 Type 2 diabetes mellitus without complications: Secondary | ICD-10-CM

## 2015-06-12 MED ORDER — ADALIMUMAB 10 MG/0.2ML ~~LOC~~ PSKT: PREFILLED_SYRINGE | SUBCUTANEOUS | Status: DC

## 2015-06-12 MED ORDER — METFORMIN HCL 500 MG PO TABS: 500.0000 mg | ORAL_TABLET | Freq: Every day | ORAL | Status: DC

## 2015-06-12 NOTE — Patient Instructions (Signed)
Great to see you. We are starting Metformin 500 mg daily with breakfast. I will call you with your lab results.  Please stop by to see Regina Barnes on your way out.  Go ahead a follow up in 3 months.

## 2015-06-12 NOTE — Progress Notes (Signed)
   Subjective:   Patient ID: Regina Barnes, female    DOB: 1966/06/28, 49 y.o.   MRN: [id number]  Regina Barnes is a pleasant 49 y.o. year old female who presents to clinic today with Other  on 06/12/2015  HPI:  Brings in labs from work dated 9/6- a1c 6.6.  Denies no increased thirst or urination.  Has not been on prednisone recently.  No family h/o diabetes.  Has been trying to cut back on carbs although she admits "not that successfully."  She does not exercise regularly.    Current Outpatient Prescriptions on File Prior to Visit  Medication Sig Dispense Refill  . folic acid (FOLVITE) 1 MG tablet Take 1 mg by mouth daily.    . Methotrexate, PF, 25 MG/0.4ML SOAJ One injection weekly     No current facility-administered medications on file prior to visit.    Allergies  Allergen Reactions  . Codeine Rash    Past Medical History  Diagnosis Date  . Tobacco abuse     No past surgical history on file.  Family History  Problem Relation Age of Onset  . Heart attack Father 19  . Breast cancer Maternal Aunt     great aunt  . Breast cancer Paternal Aunt     great aunt    Social History   Social History  . Marital Status: Single    Spouse Name: N/A  . Number of Children: 0  . 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   Social History Main Topics  . Smoking status: Former Research scientist (life sciences)  . Smokeless tobacco: Not on file  . Alcohol Use: Not on file  . Drug Use: Not on file  . Sexual Activity: Not on file   Other Topics Concern  . Not on file   Social History Narrative   The PMH, PSH, Social History, Family History, Medications, and allergies have been reviewed in [facility], and have been updated if relevant.    Review of Systems  Constitutional: Negative.   Respiratory: Negative.   Cardiovascular: Negative.   Gastrointestinal: Negative.   Endocrine: Negative.   Genitourinary: Negative.   Musculoskeletal: Negative.   Skin:  Negative.   Neurological: Negative.   Hematological: Negative.   Psychiatric/Behavioral: Negative.   All other systems reviewed and are negative.      Objective:    BP 104/80 mmHg  Pulse 71  Temp(Src) 98.1 F (36.7 C) (Oral)  Wt 193 lb (87.544 kg)  SpO2 97%   Physical Exam  Constitutional: She is oriented to person, place, and time. She appears well-developed and well-nourished. No distress.  HENT:  Head: Normocephalic.  Eyes: Conjunctivae are normal.  Cardiovascular: Normal rate.   Pulmonary/Chest: Effort normal.  Musculoskeletal: Normal range of motion.  Neurological: She is alert and oriented to person, place, and time. No cranial nerve deficit.  Skin: Skin is warm and dry.  Psychiatric: She has a normal mood and affect. Her behavior is normal. Judgment and thought content normal.  Nursing note and vitals reviewed.         Assessment & Plan:   Diabetes mellitus, new onset ([place]) - Plan: Hemoglobin A1c, Comprehensive metabolic panel, Ambulatory referral to diabetic education No Follow-up on file.

## 2015-06-12 NOTE — Progress Notes (Signed)
Pre visit review using our clinic review tool, if applicable. No additional management support is needed unless otherwise documented below in the visit note. 

## 2015-06-12 NOTE — Assessment & Plan Note (Signed)
New- >25 minutes spent in face to face time with patient, >50% spent in counselling or coordination of care Advised rechecking labs today.  Start Metformin 500 mg daily, refer to diabetic nutritionist and follow up in 3 months. The patient indicates understanding of these issues and agrees with the plan.  Orders Placed This Encounter  Procedures  . Hemoglobin A1c  . Comprehensive metabolic panel  . Lipid panel  . Ambulatory referral to diabetic education

## 2015-06-13 LAB — COMPREHENSIVE METABOLIC PANEL
A/G RATIO: 1.9 (ref 1.1–2.5)
ALT: 21 IU/L (ref 0–32)
AST: 18 IU/L (ref 0–40)
Albumin: 4.5 g/dL (ref 3.5–5.5)
Alkaline Phosphatase: 104 IU/L (ref 39–117)
BUN/Creatinine Ratio: 10 (ref 9–23)
BUN: 9 mg/dL (ref 6–24)
Bilirubin Total: 0.2 mg/dL (ref 0.0–1.2)
CALCIUM: 9.6 mg/dL (ref 8.7–10.2)
CO2: 23 mmol/L (ref 18–29)
Chloride: 100 mmol/L (ref 97–106)
Creatinine, Ser: 0.87 mg/dL (ref 0.57–1.00)
GFR, EST AFRICAN AMERICAN: 91 mL/min/{1.73_m2} (ref >59)
GFR, EST NON AFRICAN AMERICAN: 79 mL/min/{1.73_m2} (ref >59)
Globulin, Total: 2.4 g/dL (ref 1.5–4.5)
Glucose: 150 mg/dL — ABNORMAL HIGH (ref 65–99)
POTASSIUM: 4.1 mmol/L (ref 3.5–5.2)
Sodium: 143 mmol/L (ref 136–144)
TOTAL PROTEIN: 6.9 g/dL (ref 6.0–8.5)

## 2015-06-13 LAB — HEMOGLOBIN A1C
ESTIMATED AVERAGE GLUCOSE: 137 mg/dL
Hgb A1c MFr Bld: 6.4 % — ABNORMAL HIGH (ref 4.8–5.6)

## 2015-06-13 LAB — LIPID PANEL
Chol/HDL Ratio: 3.4 ratio units (ref 0.0–4.4)
Cholesterol, Total: 234 mg/dL — ABNORMAL HIGH (ref 100–199)
HDL: 68 mg/dL (ref >39)
LDL Calculated: 138 mg/dL — ABNORMAL HIGH (ref 0–99)
Triglycerides: 140 mg/dL (ref 0–149)
VLDL Cholesterol Cal: 28 mg/dL (ref 5–40)

## 2015-06-20 ENCOUNTER — Encounter: Payer: 59 | Attending: Family Medicine | Admitting: *Deleted

## 2015-06-20 ENCOUNTER — Encounter: Payer: Self-pay | Admitting: *Deleted

## 2015-06-20 VITALS — BP 114/80 | Ht 67.0 in | Wt 192.3 lb

## 2015-06-20 DIAGNOSIS — E119 Type 2 diabetes mellitus without complications: Secondary | ICD-10-CM | POA: Insufficient documentation

## 2015-06-20 NOTE — Patient Instructions (Addendum)
Check blood sugars 2 x day before breakfast and 2 hrs after supper 3 x week Exercise: Continue walking for    30  minutes   3  days a week  gradually increase to 30 minutes 5 x week Avoid sugar sweetened drinks (soda, coffee) Eat 3 meals day,  1-2 snacks a day Space meals 4-6 hours apart Bring blood sugar records to the next class Call your doctor for a prescription for:  1. Meter strips (type) One Touch Ultra Blue  checking  3-4 times per week  2. Lancets (type) One Touch Delica  checking  3-4   times per week

## 2015-06-20 NOTE — Progress Notes (Signed)
Diabetes Self-Management Education  Visit Type: First/Initial  Appt. Start Time: 1035 Appt. End Time: 1150  06/20/2015  Regina Barnes, identified by name and date of birth, is a 49 y.o. female with a diagnosis of Diabetes: Type 2.   ASSESSMENT  Blood pressure 114/80, height 5\' 7"  (1.702 m), weight 192 lb 4.8 oz (87.227 kg), last menstrual period 05/20/2015. Body mass index is 30.11 kg/(m^2).      Diabetes Self-Management Education - 06/20/15 1301    Visit Information   Visit Type First/Initial   Initial Visit   Diabetes Type Type 2   Are you currently following a meal plan? No   Are you taking your medications as prescribed? Yes   Date Diagnosed 1 month ago   Health Coping   How would you rate your overall health? Good   Psychosocial Assessment   Patient Belief/Attitude about Diabetes Other (comment)  "not sure"   Self-care barriers None   Self-management support Doctor's office;Family   Patient Concerns Nutrition/Meal planning;Medication;Monitoring;Healthy Lifestyle;Glycemic Control;Weight Control   Special Needs None   Preferred Learning Style Visual   Learning Readiness Ready   How often do you need to have someone help you when you read instructions, pamphlets, or other written materials from your doctor or pharmacy? 1 - Never   What is the last grade level you completed in school? college   Complications   Last HgB A1C per patient/outside source 6.6 %  05/01/15   How often do you check your blood sugar? 0 times/day (not testing)  Provided One Touch Ultra 2 meter and instructed on use. BG upon return demonstration was 112 mg/dL at 11:40 am - 2 hrs pp.   Have you had a dilated eye exam in the past 12 months? Yes   Have you had a dental exam in the past 12 months? Yes   Are you checking your feet? No   Dietary Intake   Breakfast smoothie with 1 cup milk, apple, berries, flax seed, almonds   Lunch salad with chicken on sandiwich   Dinner meat and veggies; eats fish  2 x week; broccolli, rice, occasional hot dogs   Beverage(s) sugar with coffee, water, occasional regular soda   Exercise   Exercise Type Light (walking / raking leaves)   How many days per week to you exercise? 3   How many minutes per day do you exercise? 30   Total minutes per week of exercise 90   Patient Education   Previous Diabetes Education No   Disease state  Definition of diabetes, type 1 and 2, and the diagnosis of diabetes;Factors that contribute to the development of diabetes;Explored patient's options for treatment of their diabetes   Nutrition management  Role of diet in the treatment of diabetes and the relationship between the three main macronutrients and blood glucose level;Carbohydrate counting   Physical activity and exercise  Role of exercise on diabetes management, blood pressure control and cardiac health.   Medications Reviewed patients medication for diabetes, action, purpose, timing of dose and side effects.   Monitoring Taught/evaluated SMBG meter.;Purpose and frequency of SMBG.;Identified appropriate SMBG and/or A1C goals.   Chronic complications Relationship between chronic complications and blood glucose control   Psychosocial adjustment Identified and addressed patients feelings and concerns about diabetes   Individualized Goals (developed by patient)   Reducing Risk Improve blood sugars Decrease medications Prevent diabetes complications Lose weight Lead a healthier lifestyle Become more fit   Outcomes   Expected Outcomes Demonstrated interest  in learning. Expect positive outcomes      Individualized Plan for Diabetes Self-Management Training:   Learning Objective:  Patient will have a greater understanding of diabetes self-management. Patient education plan is to attend individual and/or group sessions per assessed needs and concerns.   Plan:   Patient Instructions  Check blood sugars 2 x day before breakfast and 2 hrs after supper 3 x  week Exercise: Continue walking for    30  minutes   3  days a week  gradually increase to 30 minutes 5 x week Avoid sugar sweetened drinks (soda, coffee) Eat 3 meals day,  1-2 snacks a day Space meals 4-6 hours apart Bring blood sugar records to the next class Call your doctor for a prescription for:  1. Meter strips (type) One Touch Ultra Blue  checking  3-4 times per week  2. Lancets (type) One Touch Delica  checking  3-4   times per week   Expected Outcomes:  Demonstrated interest in learning. Expect positive outcomes  Education material provided: General Meal Planning Guidelines Meter - One Touch Ultra 2  If problems or questions, patient to contact team via:  Johny Drilling, [place], [place], CDE [phone]  Future DSME appointment:  Monday July 23, 2015 for Class 1

## 2015-06-22 ENCOUNTER — Encounter: Payer: Self-pay | Admitting: Family Medicine

## 2015-06-22 MED ORDER — GLUCOSE BLOOD VI STRP: ORAL_STRIP | Status: DC

## 2015-06-22 NOTE — Addendum Note (Signed)
Addended by: Modena Nunnery on: 06/22/2015 10:15 AM   Modules accepted: Orders

## 2015-08-29 ENCOUNTER — Other Ambulatory Visit: Payer: Self-pay | Admitting: *Deleted

## 2015-08-29 MED ORDER — METFORMIN HCL 500 MG PO TABS: 500.0000 mg | ORAL_TABLET | Freq: Every day | ORAL | Status: DC

## 2015-08-29 NOTE — Telephone Encounter (Signed)
Pt recently switched to mail order pharmacy

## 2015-08-29 NOTE — Addendum Note (Signed)
Addended by: Modena Nunnery on: 08/29/2015 10:19 AM   Modules accepted: Orders

## 2015-09-04 ENCOUNTER — Encounter: Payer: 59 | Attending: Family Medicine | Admitting: Dietician

## 2015-09-04 ENCOUNTER — Encounter: Payer: Self-pay | Admitting: Dietician

## 2015-09-04 VITALS — Ht 67.0 in | Wt 194.4 lb

## 2015-09-04 DIAGNOSIS — E119 Type 2 diabetes mellitus without complications: Secondary | ICD-10-CM | POA: Diagnosis present

## 2015-09-04 NOTE — Progress Notes (Signed)

## 2015-09-10 ENCOUNTER — Encounter: Payer: 59 | Admitting: *Deleted

## 2015-09-10 ENCOUNTER — Encounter: Payer: Self-pay | Admitting: *Deleted

## 2015-09-10 VITALS — Wt 194.1 lb

## 2015-09-10 DIAGNOSIS — E119 Type 2 diabetes mellitus without complications: Secondary | ICD-10-CM | POA: Diagnosis not present

## 2015-09-10 NOTE — Progress Notes (Signed)

## 2015-09-12 ENCOUNTER — Ambulatory Visit (INDEPENDENT_AMBULATORY_CARE_PROVIDER_SITE_OTHER): Payer: 59 | Admitting: Family Medicine

## 2015-09-12 ENCOUNTER — Encounter: Payer: Self-pay | Admitting: Family Medicine

## 2015-09-12 VITALS — BP 108/74 | HR 88 | Temp 98.2°F | Wt 190.8 lb

## 2015-09-12 DIAGNOSIS — E119 Type 2 diabetes mellitus without complications: Secondary | ICD-10-CM

## 2015-09-12 MED ORDER — METFORMIN HCL 500 MG PO TABS: 500.0000 mg | ORAL_TABLET | Freq: Every day | ORAL | Status: DC

## 2015-09-12 NOTE — Progress Notes (Signed)
Pre visit review using our clinic review tool, if applicable. No additional management support is needed unless otherwise documented below in the visit note. 

## 2015-09-12 NOTE — Progress Notes (Signed)
Subjective:   Patient ID: Regina Barnes, female    DOB: [birth year], 50 y.o.   MRN: [id number]  Regina Barnes is a pleasant 51 y.o. year old female who presents to clinic today with Follow-up  on 09/12/2015  HPI:  When I saw her in 05/2015, she brought in labs from work dated 9/6- a1c 6.6.  Denies no increased thirst or urination.    No family h/o diabetes.  We started her on Metformin 500 mg daily with breakfast and referred her to diabetic neuronist which she has been attending.he has one session left. We repeated the a1c in our office in 05/2015 as well and it was 6.4 here but we proceeded with our original plan. Lab Results  Component Value Date   HGBA1C 6.4* 06/12/2015   Checks FSBS fasting every morning- ranges 116-145.  Denies any episodes of hypoglycemia.   Wt Readings from Last 3 Encounters:  09/12/15 190 lb 12 oz (86.524 kg)  09/10/15 194 lb 1.6 oz (88.043 kg)  09/04/15 194 lb 6.4 oz (88.179 kg)    Current Outpatient Prescriptions on File Prior to Visit  Medication Sig Dispense Refill  . Adalimumab (HUMIRA) 10 MG/0.2ML PSKT As directed (Patient taking differently: Inject 10 mg into the skin every 14 (fourteen) days. As directed)    . CALCIUM-MAGNESIUM-ZINC PO Take 2 tablets by mouth daily.    . cholecalciferol (VITAMIN D) 1000 UNITS tablet Take 1,000 Units by mouth daily.    . folic acid (FOLVITE) 1 MG tablet Take 1 mg by mouth daily.    Marland Kitchen glucose blood test strip Use as instructed to test BS once daily E11.9 100 each 1  . metFORMIN (GLUCOPHAGE) 500 MG tablet Take 1 tablet (500 mg total) by mouth daily with breakfast. 90 tablet 0  . Methotrexate, PF, 25 MG/0.4ML SOAJ Inject 25 mg into the skin once a week. One injection weekly     No current facility-administered medications on file prior to visit.    Allergies  Allergen Reactions  . Codeine Rash    Past Medical History  Diagnosis Date  . Tobacco abuse   . Arthritis     Rheumatoid    No past surgical  history on file.  Family History  Problem Relation Age of Onset  . Heart attack Father 68  . Breast cancer Maternal Aunt     great aunt  . Breast cancer Paternal Aunt     great aunt  . Diabetes Other     Social History   Social History  . Marital Status: Single    Spouse Name: N/A  . Number of Children: 0  . 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   Social History Main Topics  . Smoking status: Current Every Day Smoker -- 0.25 packs/day for 20 years    Types: Cigarettes    Last Attempt to Quit: 05/30/2015  . Smokeless tobacco: Never Used  . Alcohol Use: 2.4 oz/week    4 Cans of beer per week  . Drug Use: Not on file  . Sexual Activity: Not on file   Other Topics Concern  . Not on file   Social History Narrative   The PMH, PSH, Social History, Family History, Medications, and allergies have been reviewed in [facility], and have been updated if relevant.    Review of Systems  Constitutional: Negative.   Respiratory: Negative.   Cardiovascular: Negative.   Gastrointestinal: Negative.   Endocrine: Negative.  Genitourinary: Negative.   Musculoskeletal: Negative.   Skin: Negative.   Neurological: Negative.   Hematological: Negative.   Psychiatric/Behavioral: Negative.   All other systems reviewed and are negative.      Objective:    BP 108/74 mmHg  Pulse 88  Temp(Src) 98.2 F (36.8 C) (Oral)  Wt 190 lb 12 oz (86.524 kg)  SpO2 97%  LMP 09/01/2015   Physical Exam  Constitutional: She is oriented to person, place, and time. She appears well-developed and well-nourished. No distress.  HENT:  Head: Normocephalic.  Eyes: Conjunctivae are normal.  Cardiovascular: Normal rate.   Pulmonary/Chest: Effort normal.  Musculoskeletal: Normal range of motion.  Neurological: She is alert and oriented to person, place, and time. No cranial nerve deficit.  Skin: Skin is warm and dry.  Psychiatric: She has a normal mood and  affect. Her behavior is normal. Judgment and thought content normal.  Nursing note and vitals reviewed.         Assessment & Plan:   Diabetes mellitus, new onset ([place]) No Follow-up on file.

## 2015-09-12 NOTE — Assessment & Plan Note (Signed)
Having higher glucose readings in the morning. Advised to start taking metformin 500 mg daily with supper instead. Check a1c today along with urine micro. The patient indicates understanding of these issues and agrees with the plan.

## 2015-09-12 NOTE — Patient Instructions (Addendum)
Great to see you.  We will call you with your lab results.   Please start taking your Metformin with dinner instead of breakfast.

## 2015-09-12 NOTE — Addendum Note (Signed)
Addended by: Daralene Milch C on: 09/12/2015 11:05 AM   Modules accepted: Orders

## 2015-09-13 ENCOUNTER — Encounter: Payer: Self-pay | Admitting: *Deleted

## 2015-09-13 LAB — COMPREHENSIVE METABOLIC PANEL
ALBUMIN: 4.6 g/dL (ref 3.5–5.5)
ALK PHOS: 93 IU/L (ref 39–117)
ALT: 15 IU/L (ref 0–32)
AST: 17 IU/L (ref 0–40)
Albumin/Globulin Ratio: 2.1 (ref 1.1–2.5)
BUN / CREAT RATIO: 15 (ref 9–23)
BUN: 13 mg/dL (ref 6–24)
Bilirubin Total: 0.3 mg/dL (ref 0.0–1.2)
CALCIUM: 10 mg/dL (ref 8.7–10.2)
CHLORIDE: 99 mmol/L (ref 96–106)
CO2: 28 mmol/L (ref 18–29)
CREATININE: 0.86 mg/dL (ref 0.57–1.00)
GFR calc Af Amer: 92 mL/min/{1.73_m2} (ref >59)
GFR calc non Af Amer: 80 mL/min/{1.73_m2} (ref >59)
GLUCOSE: 132 mg/dL — AB (ref 65–99)
Globulin, Total: 2.2 g/dL (ref 1.5–4.5)
Potassium: 4.3 mmol/L (ref 3.5–5.2)
SODIUM: 143 mmol/L (ref 134–144)
Total Protein: 6.8 g/dL (ref 6.0–8.5)

## 2015-09-13 LAB — MICROALBUMIN / CREATININE URINE RATIO
CREATININE, UR: 182.3 mg/dL
MICROALB/CREAT RATIO: 2.1 mg/g creat (ref 0.0–30.0)
Microalbumin, Urine: 3.9 ug/mL

## 2015-09-13 LAB — HEMOGLOBIN A1C
Est. average glucose Bld gHb Est-mCnc: 143 mg/dL
HEMOGLOBIN A1C: 6.6 % — AB (ref 4.8–5.6)

## 2015-09-17 ENCOUNTER — Encounter: Payer: 59 | Admitting: Dietician

## 2015-09-17 VITALS — BP 110/74 | Ht 67.0 in | Wt 191.6 lb

## 2015-09-17 DIAGNOSIS — E119 Type 2 diabetes mellitus without complications: Secondary | ICD-10-CM | POA: Diagnosis not present

## 2015-09-17 NOTE — Progress Notes (Signed)

## 2015-09-19 ENCOUNTER — Encounter: Payer: Self-pay | Admitting: *Deleted

## 2015-10-19 ENCOUNTER — Other Ambulatory Visit: Payer: Self-pay | Admitting: Family Medicine

## 2016-01-01 ENCOUNTER — Ambulatory Visit (INDEPENDENT_AMBULATORY_CARE_PROVIDER_SITE_OTHER): Payer: 59 | Admitting: Family Medicine

## 2016-01-01 ENCOUNTER — Encounter: Payer: Self-pay | Admitting: Family Medicine

## 2016-01-01 VITALS — BP 110/78 | HR 105 | Temp 97.9°F | Wt 182.8 lb

## 2016-01-01 DIAGNOSIS — M2550 Pain in unspecified joint: Secondary | ICD-10-CM | POA: Diagnosis not present

## 2016-01-01 DIAGNOSIS — E119 Type 2 diabetes mellitus without complications: Secondary | ICD-10-CM

## 2016-01-01 NOTE — Patient Instructions (Signed)
Great to see you  We will call you with your results.  Think about the pneumovax vaccine.  Have a great trip!

## 2016-01-01 NOTE — Assessment & Plan Note (Signed)
Continue current dose of Metformin. Check labs today. Orders Placed This Encounter  Procedures  . Hemoglobin A1c  . Comprehensive metabolic panel  . Lipid panel

## 2016-01-01 NOTE — Progress Notes (Signed)
Pre visit review using our clinic review tool, if applicable. No additional management support is needed unless otherwise documented below in the visit note. 

## 2016-01-01 NOTE — Addendum Note (Signed)
Addended by: Marchia Bond on: 01/01/2016 08:36 AM   Modules accepted: Orders

## 2016-01-01 NOTE — Progress Notes (Signed)
Subjective:   Patient ID: Regina Barnes, female    DOB: [birth year], 50 y.o.   MRN: [id number]  Regina Barnes is a pleasant 50 y.o. year old female who presents to clinic today with Follow-up  on 01/01/2016  HPI:  When I saw her in 05/2015, she brought in labs from work dated 9/6- a1c 6.6.  Denies no increased thirst or urination.    No family h/o diabetes.  We started her on Metformin 500 mg daily with supper and referred her to diabetic nutritionist.  We repeated the a1c in our office in 05/2015 as well and it was 6.4 here but we proceeded with our original plan. Lab Results  Component Value Date   HGBA1C 6.6* 09/12/2015   Checks FSBS fasting every morning- ranges 120s-140.  Denies any episodes of hypoglycemia.  Has lost weight.   Wt Readings from Last 3 Encounters:  01/01/16 182 lb 12 oz (82.895 kg)  09/17/15 191 lb 9.6 oz (86.909 kg)  09/12/15 190 lb 12 oz (86.524 kg)    Current Outpatient Prescriptions on File Prior to Visit  Medication Sig Dispense Refill  . Adalimumab (HUMIRA) 10 MG/0.2ML PSKT As directed (Patient taking differently: Inject 10 mg into the skin every 14 (fourteen) days. As directed)    . CALCIUM-MAGNESIUM-ZINC PO Take 2 tablets by mouth daily.    . cholecalciferol (VITAMIN D) 1000 UNITS tablet Take 1,000 Units by mouth daily.    . folic acid (FOLVITE) 1 MG tablet Take 1 mg by mouth daily.    . metFORMIN (GLUCOPHAGE) 500 MG tablet Take 1 tablet (500 mg total) by mouth daily with supper. 90 tablet 3  . Methotrexate, PF, 25 MG/0.4ML SOAJ Inject 25 mg into the skin once a week. One injection weekly    . ONE TOUCH ULTRA TEST test strip USE AS INSTRUCTED TO TEST BLOOD SUGAR ONCE DAILY 100 each 1   No current facility-administered medications on file prior to visit.    Allergies  Allergen Reactions  . Codeine Rash    Past Medical History  Diagnosis Date  . Tobacco abuse   . Arthritis     Rheumatoid    No past surgical history on file.  Family  History  Problem Relation Age of Onset  . Heart attack Father 66  . Breast cancer Maternal Aunt     great aunt  . Breast cancer Paternal Aunt     great aunt  . Diabetes Other     Social History   Social History  . Marital Status: Single    Spouse Name: N/A  . Number of Children: 0  . 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   Social History Main Topics  . Smoking status: Current Every Day Smoker -- 0.25 packs/day for 20 years    Types: Cigarettes    Last Attempt to Quit: 05/30/2015  . Smokeless tobacco: Never Used  . Alcohol Use: 2.4 oz/week    4 Cans of beer per week  . Drug Use: Not on file  . Sexual Activity: Not on file   Other Topics Concern  . Not on file   Social History Narrative   The PMH, PSH, Social History, Family History, Medications, and allergies have been reviewed in [facility], and have been updated if relevant.    Review of Systems  Constitutional: Negative.   Respiratory: Negative.   Cardiovascular: Negative.   Gastrointestinal: Negative.   Endocrine: Negative.  Genitourinary: Negative.   Musculoskeletal: Negative.   Skin: Negative.   Neurological: Negative.   Hematological: Negative.   Psychiatric/Behavioral: Negative.   All other systems reviewed and are negative.      Objective:    BP 110/78 mmHg  Pulse 105  Temp(Src) 97.9 F (36.6 C) (Oral)  Wt 182 lb 12 oz (82.895 kg)  SpO2 97%   Physical Exam  Constitutional: She is oriented to person, place, and time. She appears well-developed and well-nourished. No distress.  HENT:  Head: Normocephalic.  Eyes: Conjunctivae are normal.  Cardiovascular: Normal rate.   Pulmonary/Chest: Effort normal.  Musculoskeletal: Normal range of motion.  Neurological: She is alert and oriented to person, place, and time. No cranial nerve deficit.  Skin: Skin is warm and dry.  Psychiatric: She has a normal mood and affect. Her behavior is normal. Judgment and  thought content normal.  Nursing note and vitals reviewed.         Assessment & Plan:   Diabetes mellitus, new onset ([place]) No Follow-up on file.

## 2016-01-01 NOTE — Assessment & Plan Note (Signed)
Continue current rx- followed by rheum.

## 2016-01-02 LAB — LIPID PANEL
CHOL/HDL RATIO: 3.6 ratio (ref 0.0–4.4)
Cholesterol, Total: 217 mg/dL — ABNORMAL HIGH (ref 100–199)
HDL: 60 mg/dL (ref >39)
LDL Calculated: 137 mg/dL — ABNORMAL HIGH (ref 0–99)
Triglycerides: 100 mg/dL (ref 0–149)
VLDL Cholesterol Cal: 20 mg/dL (ref 5–40)

## 2016-01-02 LAB — COMPREHENSIVE METABOLIC PANEL
ALBUMIN: 4.4 g/dL (ref 3.5–5.5)
ALK PHOS: 93 IU/L (ref 39–117)
ALT: 17 IU/L (ref 0–32)
AST: 16 IU/L (ref 0–40)
Albumin/Globulin Ratio: 2.1 (ref 1.2–2.2)
BUN/Creatinine Ratio: 14 (ref 9–23)
BUN: 12 mg/dL (ref 6–24)
Bilirubin Total: 0.3 mg/dL (ref 0.0–1.2)
CALCIUM: 9.6 mg/dL (ref 8.7–10.2)
CHLORIDE: 101 mmol/L (ref 96–106)
CO2: 24 mmol/L (ref 18–29)
Creatinine, Ser: 0.87 mg/dL (ref 0.57–1.00)
GFR calc Af Amer: 90 mL/min/{1.73_m2} (ref >59)
GFR, EST NON AFRICAN AMERICAN: 78 mL/min/{1.73_m2} (ref >59)
GLUCOSE: 141 mg/dL — AB (ref 65–99)
Globulin, Total: 2.1 g/dL (ref 1.5–4.5)
POTASSIUM: 4.3 mmol/L (ref 3.5–5.2)
SODIUM: 142 mmol/L (ref 134–144)
TOTAL PROTEIN: 6.5 g/dL (ref 6.0–8.5)

## 2016-01-02 LAB — HEMOGLOBIN A1C
ESTIMATED AVERAGE GLUCOSE: 137 mg/dL
HEMOGLOBIN A1C: 6.4 % — AB (ref 4.8–5.6)

## 2016-04-17 ENCOUNTER — Other Ambulatory Visit: Payer: Self-pay | Admitting: Obstetrics and Gynecology

## 2016-04-17 DIAGNOSIS — Z1231 Encounter for screening mammogram for malignant neoplasm of breast: Secondary | ICD-10-CM

## 2016-05-19 ENCOUNTER — Other Ambulatory Visit: Payer: Self-pay | Admitting: Obstetrics and Gynecology

## 2016-05-19 ENCOUNTER — Ambulatory Visit
Admission: RE | Admit: 2016-05-19 | Discharge: 2016-05-19 | Disposition: A | Discharge status: Home / Self Care | Payer: 59 | Source: Ambulatory Visit | Attending: Obstetrics and Gynecology | Admitting: Obstetrics and Gynecology

## 2016-05-19 DIAGNOSIS — Z1231 Encounter for screening mammogram for malignant neoplasm of breast: Secondary | ICD-10-CM | POA: Insufficient documentation

## 2016-10-14 ENCOUNTER — Encounter: Payer: Self-pay | Admitting: Family Medicine

## 2016-10-14 ENCOUNTER — Ambulatory Visit (INDEPENDENT_AMBULATORY_CARE_PROVIDER_SITE_OTHER): Payer: 59 | Admitting: Family Medicine

## 2016-10-14 VITALS — BP 124/78 | HR 78 | Temp 97.9°F | Ht 66.5 in | Wt 181.2 lb

## 2016-10-14 DIAGNOSIS — M2550 Pain in unspecified joint: Secondary | ICD-10-CM

## 2016-10-14 DIAGNOSIS — Z01419 Encounter for gynecological examination (general) (routine) without abnormal findings: Secondary | ICD-10-CM

## 2016-10-14 DIAGNOSIS — Z1211 Encounter for screening for malignant neoplasm of colon: Secondary | ICD-10-CM | POA: Diagnosis not present

## 2016-10-14 DIAGNOSIS — E119 Type 2 diabetes mellitus without complications: Secondary | ICD-10-CM

## 2016-10-14 DIAGNOSIS — Z0000 Encounter for general adult medical examination without abnormal findings: Secondary | ICD-10-CM

## 2016-10-14 DIAGNOSIS — Z7189 Other specified counseling: Secondary | ICD-10-CM | POA: Insufficient documentation

## 2016-10-14 NOTE — Assessment & Plan Note (Signed)
Continue metformin. Due for labs today.

## 2016-10-14 NOTE — Addendum Note (Signed)
Addended by: Marchia Bond on: 10/14/2016 10:45 AM   Modules accepted: Orders

## 2016-10-14 NOTE — Patient Instructions (Signed)
Happy birthday! We will call you with your lab results and you can view them online.   We will also be calling you to make an appointment for a screening colonoscopy.

## 2016-10-14 NOTE — Assessment & Plan Note (Signed)
Inflammatory arthritis. Symptoms controlled with mtx and humira, followed by rheum.

## 2016-10-14 NOTE — Assessment & Plan Note (Signed)
Reviewed preventive care protocols, scheduled due services, and updated immunizations Discussed nutrition, exercise, diet, and healthy lifestyle.  Orders Placed This Encounter  Procedures  . CBC with Differential/Platelet  . Comprehensive metabolic panel  . Lipid panel  . TSH  . Hemoglobin A1c  . Vitamin B12  . Microalbumin / creatinine urine ratio  . Ambulatory referral to Gastroenterology

## 2016-10-14 NOTE — Progress Notes (Signed)
Subjective:   Patient ID: Regina Barnes, female    DOB: October 14, 1965, 51 y.o.   MRN: [id number]  Regina Barnes is a pleasant 51 y.o. year old female who presents to clinic today with Annual Exam  on 10/14/2016  HPI:  Has GYN- Dr. Ouida Sills, seen on 04/16/16 Mammogram 05/19/16 Eye exam scheduled for next week.  DM- Lab Results  Component Value Date   HGBA1C 6.4 (H) 01/01/2016   Due for labs.  She has been taking Metformin 500 mg daily.     Current Outpatient Prescriptions on File Prior to Visit  Medication Sig Dispense Refill  . Adalimumab (HUMIRA) 10 MG/0.2ML PSKT As directed (Patient taking differently: Inject 10 mg into the skin every 14 (fourteen) days. As directed)    . CALCIUM-MAGNESIUM-ZINC PO Take 2 tablets by mouth daily.    . cholecalciferol (VITAMIN D) 1000 UNITS tablet Take 1,000 Units by mouth daily.    . folic acid (FOLVITE) 1 MG tablet Take 1 mg by mouth daily.    . metFORMIN (GLUCOPHAGE) 500 MG tablet Take 1 tablet (500 mg total) by mouth daily with supper. 90 tablet 3  . Methotrexate, PF, 25 MG/0.4ML SOAJ Inject 25 mg into the skin once a week. One injection weekly    . ONE TOUCH ULTRA TEST test strip USE AS INSTRUCTED TO TEST BLOOD SUGAR ONCE DAILY 100 each 1   No current facility-administered medications on file prior to visit.     Allergies  Allergen Reactions  . Codeine Rash    Past Medical History:  Diagnosis Date  . Arthritis    Rheumatoid  . Tobacco abuse     No past surgical history on file.  Family History  Problem Relation Age of Onset  . Heart attack Father 22  . Diabetes Other   . Breast cancer Maternal Aunt     great aunt >50  . Breast cancer Paternal Aunt     great aunt    Social History   Social History  . Marital status: Single    Spouse name: N/A  . Number of children: 0  . 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   Social History Main Topics  . Smoking status: Current  Every Day Smoker    Packs/day: 0.25    Years: 20.00    Types: Cigarettes    Last attempt to quit: 05/30/2015  . Smokeless tobacco: Never Used  . Alcohol use 2.4 oz/week    4 Cans of beer per week  . Drug use: Unknown  . Sexual activity: Not on file   Other Topics Concern  . Not on file   Social History Narrative  . No narrative on file   The PMH, PSH, Social History, Family History, Medications, and allergies have been reviewed in [facility], and have been updated if relevant.   Review of Systems  Constitutional: Negative.   HENT: Negative.   Eyes: Negative.   Respiratory: Negative.   Cardiovascular: Negative.   Gastrointestinal: Negative.   Endocrine: Negative.   Genitourinary: Negative.   Musculoskeletal: Negative.   Skin: Negative.   Allergic/Immunologic: Negative.   Neurological: Negative.   Hematological: Negative.   Psychiatric/Behavioral: Negative.   All other systems reviewed and are negative.      Objective:    BP 124/78   Pulse 78   Temp 97.9 F (36.6 C) (Oral)   Ht 5' 6.5" (1.689 m)   Wt 181 lb 4 oz (  82.2 kg)   SpO2 97%   BMI 28.82 kg/m    Physical Exam    General:  Well-developed,well-nourished,in no acute distress; alert,appropriate and cooperative throughout examination Head:  normocephalic and atraumatic.   Eyes:  vision grossly intact, PERRL Ears:  R ear normal and L ear normal externally, TMs clear bilaterally Nose:  no external deformity.   Mouth:  good dentition.   Neck:  No deformities, masses, or tenderness noted. Breasts:  No mass, nodules, thickening, tenderness, bulging, retraction, inflamation, nipple discharge or skin changes noted.   Lungs:  Normal respiratory effort, chest expands symmetrically. Lungs are clear to auscultation, no crackles or wheezes. Heart:  Normal rate and regular rhythm. S1 and S2 normal without gallop, murmur, click, rub or other extra sounds. Abdomen:  Bowel sounds positive,abdomen soft and non-tender without  masses, organomegaly or hernias noted. Msk:  No deformity or scoliosis noted of thoracic or lumbar spine.   Extremities:  No clubbing, cyanosis, edema, or deformity noted with normal full range of motion of all joints.   Neurologic:  alert & oriented X3 and gait normal.   Skin:  Intact without suspicious lesions or rashes Cervical Nodes:  No lymphadenopathy noted Axillary Nodes:  No palpable lymphadenopathy Psych:  Cognition and judgment appear intact. Alert and cooperative with normal attention span and concentration. No apparent delusions, illusions, hallucinations      Assessment & Plan:   Well woman exam - Plan: CBC with Differential/Platelet, Comprehensive metabolic panel, Lipid panel, TSH, Vitamin B12  Diabetes mellitus, new onset ([place]) - Plan: Hemoglobin A1c, Microalbumin / creatinine urine ratio  Polyarthralgia  Screening for malignant neoplasm of colon - Plan: Ambulatory referral to Gastroenterology No Follow-up on file.

## 2016-10-14 NOTE — Progress Notes (Signed)
Pre visit review using our clinic review tool, if applicable. No additional management support is needed unless otherwise documented below in the visit note. 

## 2016-10-15 LAB — LIPID PANEL
CHOLESTEROL TOTAL: 238 mg/dL — AB (ref 100–199)
Chol/HDL Ratio: 3.2 (ref 0.0–4.4)
HDL: 74 mg/dL (ref >39)
LDL Calculated: 142 — ABNORMAL HIGH (ref 0–99)
TRIGLYCERIDES: 109 mg/dL (ref 0–149)
VLDL CHOLESTEROL CAL: 22 (ref 5–40)

## 2016-10-15 LAB — HEMOGLOBIN A1C
Est. average glucose Bld gHb Est-mCnc: 126
HEMOGLOBIN A1C: 6 % — AB (ref 4.8–5.6)

## 2016-10-15 LAB — COMPREHENSIVE METABOLIC PANEL
ALK PHOS: 100 IU/L (ref 39–117)
ALT: 26 IU/L (ref 0–32)
AST: 22 IU/L (ref 0–40)
Albumin/Globulin Ratio: 2.1 (ref 1.2–2.2)
Albumin: 4.5 g/dL (ref 3.5–5.5)
BILIRUBIN TOTAL: 0.3 mg/dL (ref 0.0–1.2)
BUN/Creatinine Ratio: 12 (ref 9–23)
BUN: 10 mg/dL (ref 6–24)
CHLORIDE: 102 mmol/L (ref 96–106)
CO2: 25 mmol/L (ref 18–29)
CREATININE: 0.81 mg/dL (ref 0.57–1.00)
Calcium: 10 mg/dL (ref 8.7–10.2)
GFR calc Af Amer: 99 (ref >59)
GFR calc non Af Amer: 86 (ref >59)
GLUCOSE: 130 mg/dL — AB (ref 65–99)
Globulin, Total: 2.1 (ref 1.5–4.5)
Potassium: 4.9 mmol/L (ref 3.5–5.2)
Sodium: 144 mmol/L (ref 134–144)
Total Protein: 6.6 g/dL (ref 6.0–8.5)

## 2016-10-15 LAB — CBC WITH DIFFERENTIAL/PLATELET
BASOS ABS: 0 10*3/uL (ref 0.0–0.2)
Basos: 0 %
EOS (ABSOLUTE): 0.1 10*3/uL (ref 0.0–0.4)
Eos: 1 %
Hematocrit: 39.5 % (ref 34.0–46.6)
Hemoglobin: 12.8 g/dL (ref 11.1–15.9)
Immature Grans (Abs): 0 10*3/uL (ref 0.0–0.1)
Immature Granulocytes: 0 %
LYMPHS ABS: 2.4 10*3/uL (ref 0.7–3.1)
LYMPHS: 23 %
MCH: 30.6 pg (ref 26.6–33.0)
MCHC: 32.4 g/dL (ref 31.5–35.7)
MCV: 95 fL (ref 79–97)
MONOCYTES: 4 %
Monocytes Absolute: 0.5 10*3/uL (ref 0.1–0.9)
NEUTROS ABS: 7.7 10*3/uL — AB (ref 1.4–7.0)
Neutrophils: 72 %
PLATELETS: 262 10*3/uL (ref 150–379)
RBC: 4.18 x10E6/uL (ref 3.77–5.28)
RDW: 14.1 % (ref 12.3–15.4)
WBC: 10.7 10*3/uL (ref 3.4–10.8)

## 2016-10-15 LAB — TSH: TSH: 2.31 u[IU]/mL (ref 0.450–4.500)

## 2016-10-15 LAB — VITAMIN B12: VITAMIN B 12: 632 pg/mL (ref 232–1245)

## 2016-10-15 LAB — MICROALBUMIN / CREATININE URINE RATIO
CREATININE, UR: 120.4 mg/dL
MICROALB/CREAT RATIO: 3.9 (ref 0.0–30.0)
MICROALBUM., U, RANDOM: 4.7 ug/mL

## 2016-10-23 ENCOUNTER — Other Ambulatory Visit: Payer: Self-pay

## 2016-10-23 ENCOUNTER — Telehealth: Payer: Self-pay

## 2016-10-23 DIAGNOSIS — Z1211 Encounter for screening for malignant neoplasm of colon: Secondary | ICD-10-CM

## 2016-10-23 DIAGNOSIS — M1990 Unspecified osteoarthritis, unspecified site: Secondary | ICD-10-CM | POA: Insufficient documentation

## 2016-10-23 DIAGNOSIS — Z720 Tobacco use: Secondary | ICD-10-CM | POA: Insufficient documentation

## 2016-10-23 NOTE — Telephone Encounter (Signed)
Gastroenterology Pre-Procedure Review  Request Date: 12/19/16 Requesting Physician: Dr. Vicente Males  PATIENT REVIEW QUESTIONS: The patient responded to the following health history questions as indicated:    1. Are you having any GI issues? no 2. Do you have a personal history of Polyps? no 3. Do you have a family history of Colon Cancer or Polyps? no 4. Diabetes Mellitus? no 5. Joint replacements in the past 12 months?no 6. Major health problems in the past 3 months?no 7. Any artificial heart valves, MVP, or defibrillator?no    MEDICATIONS & ALLERGIES:    Patient reports the following regarding taking any anticoagulation/antiplatelet therapy:   Plavix, Coumadin, Eliquis, Xarelto, Lovenox, Pradaxa, Brilinta, or Effient? no Aspirin? no  Patient confirms/reports the following medications:  Current Outpatient Prescriptions  Medication Sig Dispense Refill  . Adalimumab (HUMIRA) 10 MG/0.2ML PSKT As directed (Patient taking differently: Inject 10 mg into the skin every 14 (fourteen) days. As directed)    . CALCIUM-MAGNESIUM-ZINC PO Take 2 tablets by mouth daily.    . cholecalciferol (VITAMIN D) 1000 UNITS tablet Take 1,000 Units by mouth daily.    . folic acid (FOLVITE) 1 MG tablet Take 1 mg by mouth daily.    . metFORMIN (GLUCOPHAGE) 500 MG tablet Take 1 tablet (500 mg total) by mouth daily with supper. 90 tablet 3  . Methotrexate, PF, 25 MG/0.4ML SOAJ Inject 25 mg into the skin once a week. One injection weekly    . ONE TOUCH ULTRA TEST test strip USE AS INSTRUCTED TO TEST BLOOD SUGAR ONCE DAILY 100 each 1  . prednisoLONE acetate (PRED FORTE) 1 % ophthalmic suspension PLACE 1 DROP INTO BOTH EYES 4 TIMES A DAY FOR 7 DAYS  0   No current facility-administered medications for this visit.     Patient confirms/reports the following allergies:  Allergies  Allergen Reactions  . Codeine Rash    No orders of the defined types were placed in this encounter.   AUTHORIZATION INFORMATION Primary  Insurance: 1D#: Group #:  Secondary Insurance: 1D#: Group #:  SCHEDULE INFORMATION: Date: 12/19/16 Time: Location: [place]

## 2016-11-26 ENCOUNTER — Other Ambulatory Visit: Payer: Self-pay | Admitting: Family Medicine

## 2016-12-18 ENCOUNTER — Encounter: Payer: Self-pay | Admitting: *Deleted

## 2016-12-19 ENCOUNTER — Ambulatory Visit: Payer: 59 | Admitting: Anesthesiology

## 2016-12-19 ENCOUNTER — Ambulatory Visit
Admission: RE | Admit: 2016-12-19 | Discharge: 2016-12-19 | Disposition: A | Discharge status: Home / Self Care | Payer: 59 | Source: Ambulatory Visit | Attending: Gastroenterology | Admitting: Gastroenterology

## 2016-12-19 ENCOUNTER — Encounter
Admission: RE | Disposition: A | Discharge status: Home / Self Care | Payer: Self-pay | Source: Ambulatory Visit | Attending: Gastroenterology

## 2016-12-19 DIAGNOSIS — E119 Type 2 diabetes mellitus without complications: Secondary | ICD-10-CM | POA: Insufficient documentation

## 2016-12-19 DIAGNOSIS — Z1211 Encounter for screening for malignant neoplasm of colon: Secondary | ICD-10-CM | POA: Diagnosis not present

## 2016-12-19 DIAGNOSIS — Z79899 Other long term (current) drug therapy: Secondary | ICD-10-CM | POA: Diagnosis not present

## 2016-12-19 DIAGNOSIS — Z7984 Long term (current) use of oral hypoglycemic drugs: Secondary | ICD-10-CM | POA: Diagnosis not present

## 2016-12-19 DIAGNOSIS — M1990 Unspecified osteoarthritis, unspecified site: Secondary | ICD-10-CM | POA: Insufficient documentation

## 2016-12-19 DIAGNOSIS — F17210 Nicotine dependence, cigarettes, uncomplicated: Secondary | ICD-10-CM | POA: Diagnosis not present

## 2016-12-19 DIAGNOSIS — D175 Benign lipomatous neoplasm of intra-abdominal organs: Secondary | ICD-10-CM | POA: Diagnosis not present

## 2016-12-19 DIAGNOSIS — D1779 Benign lipomatous neoplasm of other sites: Secondary | ICD-10-CM | POA: Insufficient documentation

## 2016-12-19 HISTORY — DX: Type 2 diabetes mellitus without complications: E11.9

## 2016-12-19 HISTORY — PX: COLONOSCOPY WITH PROPOFOL: SHX5780

## 2016-12-19 LAB — HM COLONOSCOPY

## 2016-12-19 LAB — GLUCOSE, CAPILLARY: Glucose-Capillary: 123 mg/dL — ABNORMAL HIGH (ref 65–99)

## 2016-12-19 SURGERY — COLONOSCOPY WITH PROPOFOL
Anesthesia: General

## 2016-12-19 MED ORDER — PROPOFOL 500 MG/50ML IV EMUL
INTRAVENOUS | Status: AC
  Filled 2016-12-19: qty 50

## 2016-12-19 MED ORDER — PROPOFOL 10 MG/ML IV BOLUS
INTRAVENOUS | Status: DC | PRN
  Administered 2016-12-19: 70 mg via INTRAVENOUS
  Administered 2016-12-19: 20 mg via INTRAVENOUS
  Administered 2016-12-19: 10 mg via INTRAVENOUS

## 2016-12-19 MED ORDER — LIDOCAINE HCL (PF) 2 % IJ SOLN
INTRAMUSCULAR | Status: DC | PRN
  Administered 2016-12-19: 50 mg via INTRADERMAL

## 2016-12-19 MED ORDER — PROPOFOL 500 MG/50ML IV EMUL
INTRAVENOUS | Status: DC | PRN
  Administered 2016-12-19: 150 ug/kg/min via INTRAVENOUS

## 2016-12-19 MED ORDER — SODIUM CHLORIDE 0.9 % IV SOLN
INTRAVENOUS | Status: DC
  Administered 2016-12-19: 08:00:00 via INTRAVENOUS

## 2016-12-19 MED ORDER — SODIUM CHLORIDE 0.9 % IV SOLN
INTRAVENOUS | Status: DC | PRN
  Administered 2016-12-19: 08:00:00 via INTRAVENOUS

## 2016-12-19 NOTE — Transfer of Care (Signed)
Immediate Anesthesia Transfer of Care Note  Patient: Regina Barnes  Procedure(s) Performed: Procedure(s): COLONOSCOPY WITH PROPOFOL (N/A)  Patient Location: Endoscopy Unit  Anesthesia Type:General  Level of Consciousness: awake and alert   Airway & Oxygen Therapy: Patient Spontanous Breathing and Patient connected to nasal cannula oxygen  Post-op Assessment: Report given to RN and Post -op Vital signs reviewed and stable  Post vital signs: Reviewed and stable  Last Vitals:  Vitals:   12/19/16 0756  BP: 116/61  Pulse: 88  Resp: 18  Temp: (!) 35.8 C    Last Pain:  Vitals:   12/19/16 0756  TempSrc: Tympanic         Complications: No apparent anesthesia complications

## 2016-12-19 NOTE — Op Note (Signed)
[facility]astroenterology Patient Name: Regina Barnes Procedure Date: 12/19/2016 8:24 AM MRN: [id number] Account #: [id number] Date of Birth: [birth year] Admit Type: Outpatient Age: 51 Room: [facility] ENDO ROOM 4 Gender: Female Note Status: Finalized Procedure:            Colonoscopy Indications:          Screening for colorectal malignant neoplasm Providers:            Jonathon Bellows MD, MD Referring MD:         Marciano Sequin. Deborra Medina (Referring MD) Medicines:            Monitored Anesthesia Care Complications:        No immediate complications. Procedure:            Pre-Anesthesia Assessment:                       - Prior to the procedure, a History and Physical was                        performed, and patient medications, allergies and                        sensitivities were reviewed. The patient's tolerance of                        previous anesthesia was reviewed.                       - The risks and benefits of the procedure and the                        sedation options and risks were discussed with the                        patient. All questions were answered and informed                        consent was obtained.                       - ASA Grade Assessment: II - A patient with mild                        systemic disease.                       After obtaining informed consent, the colonoscope was                        passed under direct vision. Throughout the procedure,                        the patient's blood pressure, pulse, and oxygen                        saturations were monitored continuously. The                        Colonoscope was introduced through the anus and  advanced to the the terminal ileum. The colonoscopy was                        performed with ease. The patient tolerated the                        procedure well. The quality of the bowel preparation                        was good. Findings:      The perianal  and digital rectal examinations were normal.      There was a medium-sized lipoma, 15 mm in diameter, in the ascending       colon. Positive "pillow sign" when probed with a forceps      The terminal ileum appeared normal.      The exam was otherwise without abnormality on direct and retroflexion       views. Impression:           - Medium-sized lipoma in the ascending colon.                       - The examined portion of the ileum was normal.                       - The examination was otherwise normal on direct and                        retroflexion views.                       - No specimens collected. Recommendation:       - Discharge patient to home (with escort).                       - Resume previous diet.                       - Continue present medications.                       - Repeat colonoscopy in 10 years for screening purposes. Procedure Code(s):    --- Professional ---                       J1941, Colorectal cancer screening; colonoscopy on                        individual not meeting criteria for high risk Diagnosis Code(s):    --- Professional ---                       Z12.11, Encounter for screening for malignant neoplasm                        of colon                       D17.5, Benign lipomatous neoplasm of intra-abdominal                        organs CPT copyright 2016 American Medical Association. All rights reserved. The codes documented in this report are preliminary and  upon coder review may  be revised to meet current compliance requirements. Jonathon Bellows, MD Jonathon Bellows MD, MD 12/19/2016 8:45:58 AM This report has been signed electronically. Number of Addenda: 0 Note Initiated On: 12/19/2016 8:24 AM Scope Withdrawal Time: 0 hours 10 minutes 55 seconds  Total Procedure Duration: 0 hours 15 minutes 4 seconds       [facility]

## 2016-12-19 NOTE — Anesthesia Post-op Follow-up Note (Signed)
Anesthesia QCDR form completed.        

## 2016-12-19 NOTE — Anesthesia Preprocedure Evaluation (Signed)
Anesthesia Evaluation  Patient identified by MRN, date of birth, ID band Patient awake    Reviewed: Allergy & Precautions, NPO status , Patient's Chart, lab work & pertinent test results  History of Anesthesia Complications Negative for: history of anesthetic complications  Airway Mallampati: II  TM Distance: <3 FB Neck ROM: Full    Dental no notable dental hx.    Pulmonary neg sleep apnea, neg COPD, Current Smoker,    breath sounds clear to auscultation- rhonchi (-) wheezing      Cardiovascular Exercise Tolerance: Good (-) hypertension(-) CAD and (-) Past MI  Rhythm:Regular Rate:Normal - Systolic murmurs and - Diastolic murmurs    Neuro/Psych negative neurological ROS  negative psych ROS   GI/Hepatic negative GI ROS, Neg liver ROS,   Endo/Other  diabetes, Oral Hypoglycemic Agents  Renal/GU negative Renal ROS     Musculoskeletal  (+) Arthritis ,   Abdominal (+) - obese,   Peds  Hematology negative hematology ROS (+)   Anesthesia Other Findings Past Medical History: No date: Arthritis     Comment: Rheumatoid No date: Diabetes mellitus without complication (HCC) No date: Tobacco abuse   Reproductive/Obstetrics                             Anesthesia Physical Anesthesia Plan  ASA: II  Anesthesia Plan: General   Post-op Pain Management:    Induction: Intravenous  Airway Management Planned: Natural Airway  Additional Equipment:   Intra-op Plan:   Post-operative Plan:   Informed Consent: I have reviewed the patients History and Physical, chart, labs and discussed the procedure including the risks, benefits and alternatives for the proposed anesthesia with the patient or authorized representative who has indicated his/her understanding and acceptance.   Dental advisory given  Plan Discussed with: CRNA and Anesthesiologist  Anesthesia Plan Comments:         Anesthesia  Quick Evaluation

## 2016-12-19 NOTE — Anesthesia Postprocedure Evaluation (Signed)
Anesthesia Post Note  Patient: Regina Barnes  Procedure(s) Performed: Procedure(s) (LRB): COLONOSCOPY WITH PROPOFOL (N/A)  Patient location during evaluation: Endoscopy Anesthesia Type: General Level of consciousness: awake and alert and oriented Pain management: pain level controlled Vital Signs Assessment: post-procedure vital signs reviewed and stable Respiratory status: spontaneous breathing, nonlabored ventilation and respiratory function stable Cardiovascular status: blood pressure returned to baseline and stable Postop Assessment: no signs of nausea or vomiting Anesthetic complications: no     Last Vitals:  Vitals:   12/19/16 0848 12/19/16 0858  BP:  112/71  Pulse:  76  Resp:  16  Temp: (P) 36.2 C     Last Pain:  Vitals:   12/19/16 0848  TempSrc: (P) Tympanic                 Mckenleigh Tarlton

## 2016-12-19 NOTE — H&P (Signed)
Jonathon Bellows MD [address] [place], [place] [address] Phone: [phone] Fax : [phone]  Primary Care Physician:  Arnette Norris, MD Primary Gastroenterologist:  Dr. Jonathon Bellows   Pre-Procedure History & Physical: HPI:  Regina Barnes is a 51 y.o. female is here for an colonoscopy.   Past Medical History:  Diagnosis Date  . Arthritis    Rheumatoid  . Diabetes mellitus without complication ([place])   . Tobacco abuse     History reviewed. No pertinent surgical history.  Prior to Admission medications   Medication Sig Start Date End Date Taking? Authorizing Provider  Adalimumab (HUMIRA) 10 MG/0.2ML PSKT As directed Patient taking differently: Inject 10 mg into the skin every 14 (fourteen) days. As directed 06/12/15  Yes Lucille Passy, MD  CALCIUM-MAGNESIUM-ZINC PO Take 2 tablets by mouth daily.   Yes Historical Provider, MD  cholecalciferol (VITAMIN D) 1000 UNITS tablet Take 1,000 Units by mouth daily.   Yes Historical Provider, MD  folic acid (FOLVITE) 1 MG tablet Take 1 mg by mouth daily.   Yes Historical Provider, MD  Methotrexate, PF, 25 MG/0.4ML SOAJ Inject 25 mg into the skin once a week. One injection weekly   Yes Historical Provider, MD  ONE TOUCH ULTRA TEST test strip USE AS INSTRUCTED TO TEST BLOOD SUGAR ONCE DAILY 10/19/15  Yes Lucille Passy, MD  metFORMIN (GLUCOPHAGE) 500 MG tablet TAKE 1 TABLET BY MOUTH  DAILY WITH SUPPER 11/27/16   Lucille Passy, MD  prednisoLONE acetate (PRED FORTE) 1 % ophthalmic suspension PLACE 1 DROP INTO BOTH EYES 4 TIMES A DAY FOR 7 DAYS 08/29/16   Historical Provider, MD    Allergies as of 10/23/2016 - Review Complete 10/14/2016  Allergen Reaction Noted  . Codeine Rash 12/01/2012    Family History  Problem Relation Age of Onset  . Heart attack Father 51  . Diabetes Other   . Breast cancer Maternal Aunt     great aunt >50  . Breast cancer Paternal Aunt     great aunt    Social History   Social History  . Marital status: Single    Spouse name: N/A  . Number of children: 0  . 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   Social History Main Topics  . Smoking status: Current Every Day Smoker    Packs/day: 0.25    Years: 20.00    Types: Cigarettes    Last attempt to quit: 05/30/2015  . Smokeless tobacco: Never Used  . Alcohol use 2.4 oz/week    4 Cans of beer per week     Comment: occasionally  . Drug use: No  . Sexual activity: Not on file   Other Topics Concern  . Not on file   Social History Narrative  . No narrative on file    Review of Systems: See HPI, otherwise negative ROS  Physical Exam: BP 116/61   Pulse 88   Temp (!) 96.5 F (35.8 C) (Tympanic)   Resp 18   Ht 5\' 7"  (1.702 m)   Wt 180 lb (81.6 kg)   LMP 11/20/2016 (Approximate)   SpO2 100%   BMI 28.19 kg/m  General:   Alert,  pleasant and cooperative in NAD Head:  Normocephalic and atraumatic. Neck:  Supple; no masses or thyromegaly. Lungs:  Clear throughout to auscultation.    Heart:  Regular rate and rhythm. Abdomen:  Soft, nontender and nondistended. Normal bowel sounds, without guarding,  and without rebound.   Neurologic:  Alert and  oriented x4;  grossly normal neurologically.  Impression/Plan: Regina Barnes is here for an colonoscopy to be performed for Screening colonoscopy average risk    Risks, benefits, limitations, and alternatives regarding  colonoscopy have been reviewed with the patient.  Questions have been answered.  All parties agreeable.   Jonathon Bellows, MD  12/19/2016, 8:03 AM

## 2016-12-22 ENCOUNTER — Encounter: Payer: Self-pay | Admitting: Gastroenterology

## 2017-04-06 IMAGING — MG MM DIGITAL SCREENING BILAT W/ CAD
4 series · 4 of 4 positions shown · non-contrast
Comparison: Previous exam(s).

CLINICAL DATA: Screening.

EXAM:
DIGITAL SCREENING BILATERAL MAMMOGRAM WITH CAD

[L CC]
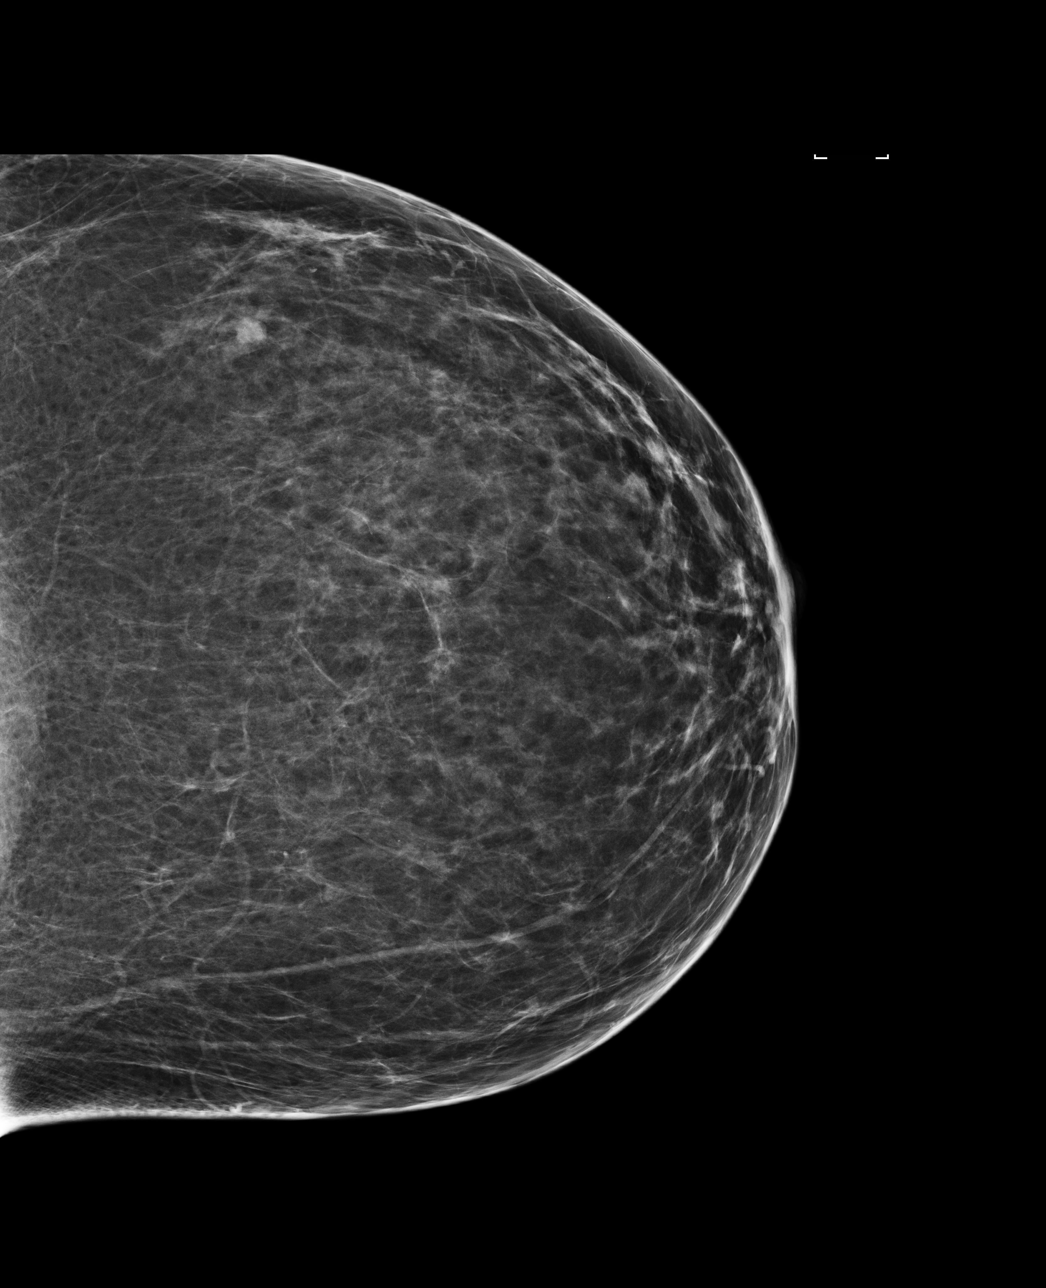

[R CC]
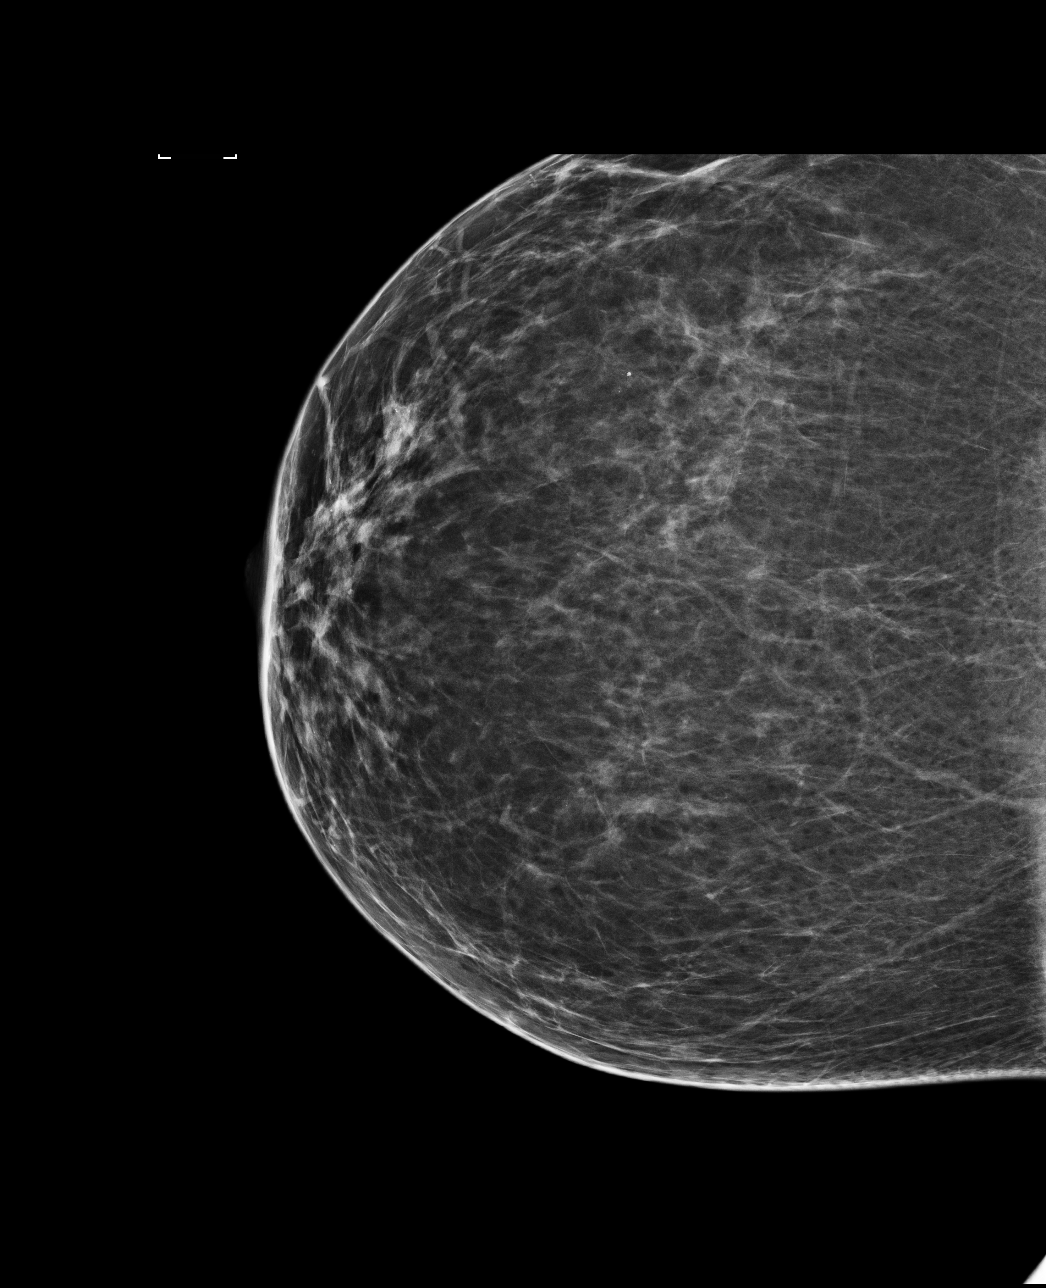

[L MLO]
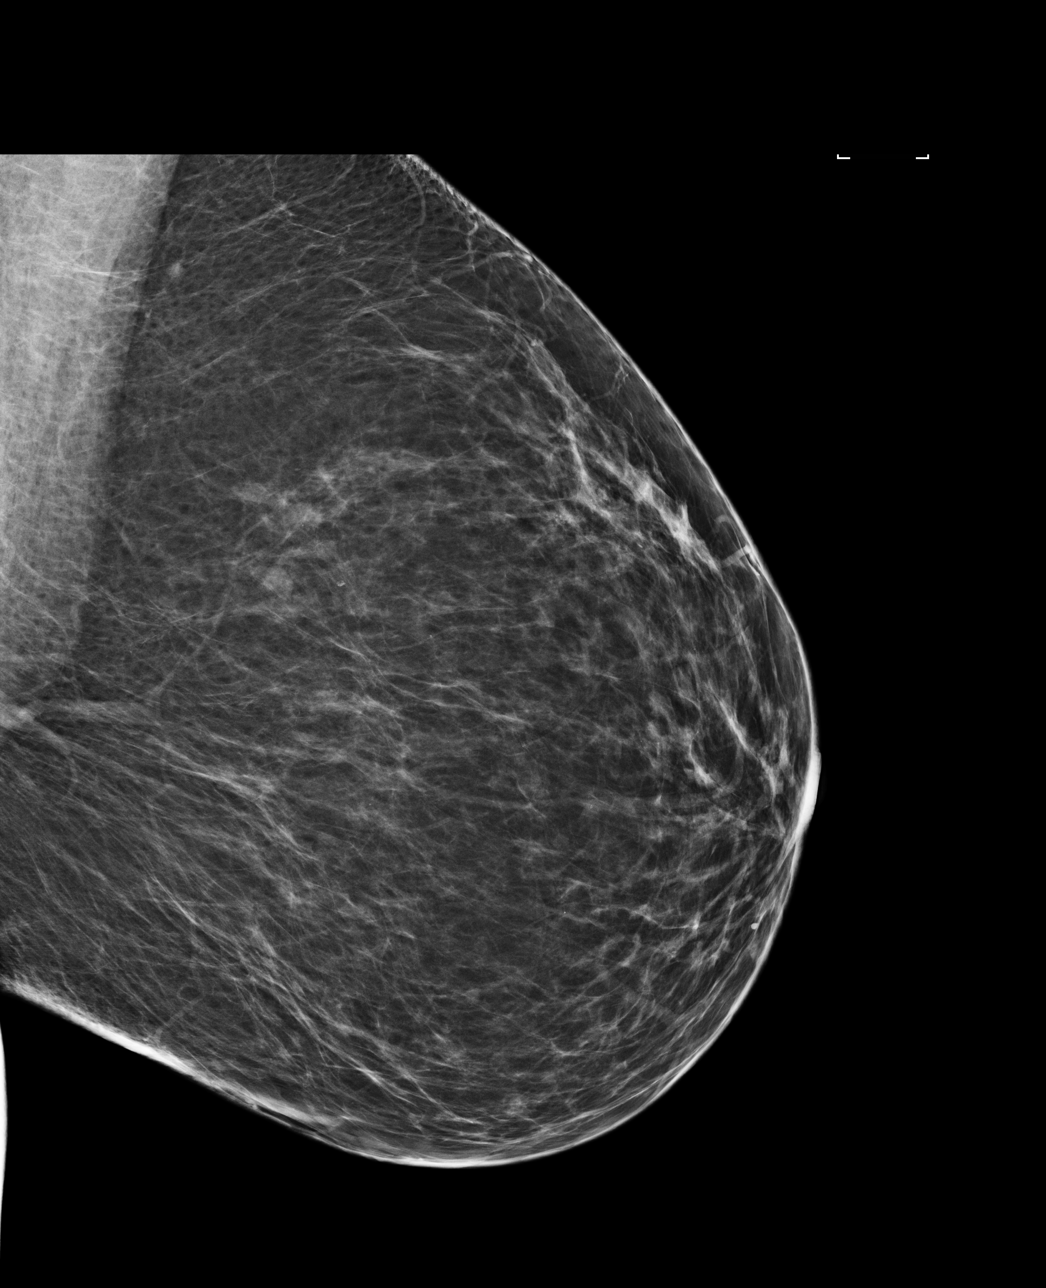

[R MLO]
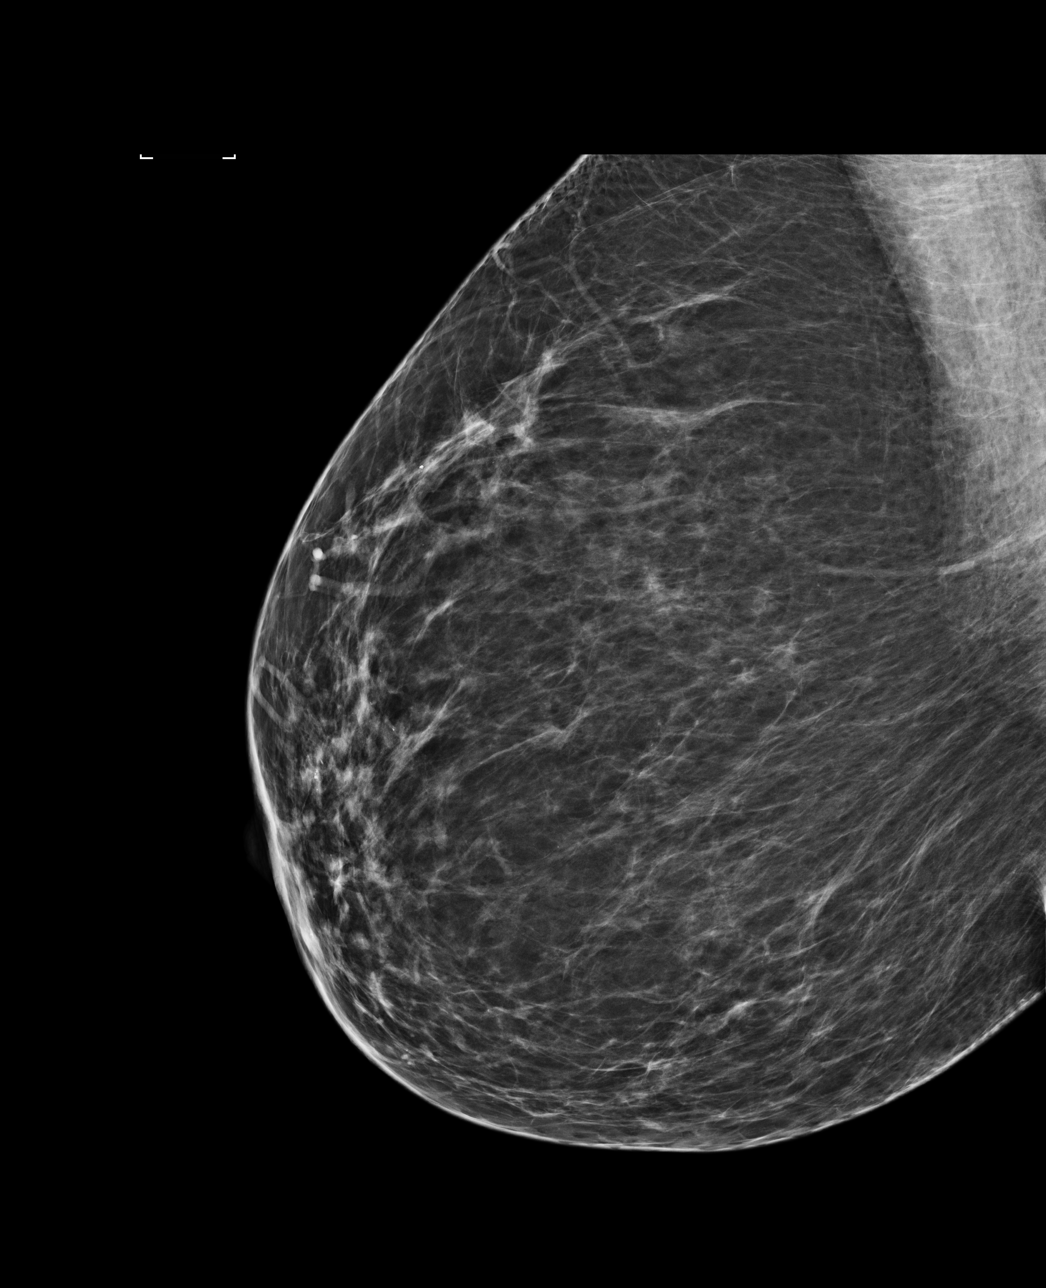

[4 of 4 positions shown; findings below may reference images not displayed]

ACR Breast Density Category b: There are scattered areas of
fibroglandular density.
FINDINGS: There are no findings suspicious for malignancy. Images were
processed with CAD.
IMPRESSION: No mammographic evidence of malignancy. A result letter of this
screening mammogram will be mailed directly to the patient.

RECOMMENDATION:
Screening mammogram in one year. (Code:AS-G-LCT)

BI-RADS CATEGORY  1: Negative.

## 2017-05-20 ENCOUNTER — Other Ambulatory Visit: Payer: Self-pay | Admitting: Obstetrics and Gynecology

## 2017-05-20 DIAGNOSIS — Z1231 Encounter for screening mammogram for malignant neoplasm of breast: Secondary | ICD-10-CM

## 2017-06-09 ENCOUNTER — Ambulatory Visit
Admission: RE | Admit: 2017-06-09 | Discharge: 2017-06-09 | Disposition: A | Discharge status: Home / Self Care | Payer: 59 | Source: Ambulatory Visit | Attending: Obstetrics and Gynecology | Admitting: Obstetrics and Gynecology

## 2017-06-09 DIAGNOSIS — Z1231 Encounter for screening mammogram for malignant neoplasm of breast: Secondary | ICD-10-CM | POA: Insufficient documentation

## 2017-07-30 ENCOUNTER — Ambulatory Visit: Payer: 59 | Admitting: Family Medicine

## 2017-07-30 VITALS — BP 114/78 | HR 86 | Temp 98.6°F | Ht 66.5 in | Wt 178.4 lb

## 2017-07-30 DIAGNOSIS — M25561 Pain in right knee: Secondary | ICD-10-CM | POA: Diagnosis not present

## 2017-07-30 MED ORDER — GLUCOSE BLOOD VI STRP: ORAL_STRIP | 1 refills | Status: DC

## 2017-07-30 NOTE — Progress Notes (Signed)
SUBJECTIVE: Regina Barnes is a 51 y.o. female who complains of right knee pain 2 week(s) ago. Mechanism of injury: no known injury. Immediate symptoms: immediate pain. Symptoms have been constant since that time. 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  Current Outpatient Medications on File Prior to Visit  Medication Sig Dispense Refill  . Adalimumab (HUMIRA) 10 MG/0.2ML PSKT As directed (Patient taking differently: Inject 10 mg into the skin every 14 (fourteen) days. As directed)    . CALCIUM-MAGNESIUM-ZINC PO Take 2 tablets by mouth daily.    . cholecalciferol (VITAMIN D) 1000 UNITS tablet Take 1,000 Units by mouth daily.    . folic acid (FOLVITE) 1 MG tablet Take 1 mg by mouth daily.    . metFORMIN (GLUCOPHAGE) 500 MG tablet TAKE 1 TABLET BY MOUTH  DAILY WITH SUPPER 90 tablet 2  . Methotrexate, PF, 25 MG/0.4ML SOAJ Inject 25 mg into the skin once a week. One injection weekly    . ONE TOUCH ULTRA TEST test strip USE AS INSTRUCTED TO TEST BLOOD SUGAR ONCE DAILY 100 each 1   No current facility-administered medications on file prior to visit.     Allergies  Allergen Reactions  . Codeine Rash    Past Medical History:  Diagnosis Date  . Arthritis    Rheumatoid  . Diabetes mellitus without complication ([place])   . Tobacco abuse     Past Surgical History:  Procedure Laterality Date  . COLONOSCOPY WITH PROPOFOL N/A 12/19/2016   Procedure: COLONOSCOPY WITH PROPOFOL;  Surgeon: Jonathon Bellows, MD;  Location: [facility] ENDOSCOPY;  Service: Endoscopy;  Laterality: N/A;    Family History  Problem Relation Age of Onset  . Heart attack Father 84  . Diabetes Other   . Breast cancer Maternal Aunt        great aunt >50  . Breast cancer Paternal Aunt        great aunt    Social History   Socioeconomic History  . Marital status: Single    Spouse name: Not on file  . Number of children: 0  . Years of education: Not on file  . Highest education  level: Not on file  Social Needs  . Financial resource strain: Not on file  . Food insecurity - worry: Not on file  . Food insecurity - inability: Not on file  . Transportation needs - medical: Not on file  . Transportation needs - non-medical: Not on file  Occupational History  . Occupation: Education administrator: [employer]  Tobacco Use  . Smoking status: Current Every Day Smoker    Packs/day: 0.25    Years: 20.00    Pack years: 5.00    Types: Cigarettes    Last attempt to quit: 05/30/2015    Years since quitting: 2.1  . Smokeless tobacco: Never Used  Substance and Sexual Activity  . Alcohol use: Yes    Alcohol/week: 2.4 oz    Types: 4 Cans of beer per week    Comment: occasionally  . Drug use: No  . Sexual activity: Not on file  Other Topics Concern  . Not on file  Social History Narrative  . Not on file   The PMH, PSH, Social History, Family History, Medications, and allergies have been reviewed in [facility], and have been updated if relevant.  OBJECTIVE: BP 114/78 (BP Location: Left Arm, Patient Position: Sitting, Cuff Size: Normal)   Pulse 86   Temp 98.6  F (37 C) (Oral)   Ht 5' 6.5" (1.689 m)   Wt 178 lb 6.4 oz (80.9 kg)   LMP 12/15/2016   SpO2 97%   BMI 28.36 kg/m   Vital signs as noted above. Appearance: alert, well appearing, and in no distress. Knee exam: normal exam, no swelling, tenderness, instability; ligaments intact, FROM. X-ray: not indicated.  ASSESSMENT: Knee internal derangement/OA  PLAN: rest the injured area as much as practical, apply ice packs See orders for this visit as documented in the electronic medical record.

## 2017-10-01 ENCOUNTER — Other Ambulatory Visit: Payer: Self-pay | Admitting: Family Medicine

## 2018-03-29 ENCOUNTER — Telehealth: Payer: Self-pay | Admitting: Family

## 2018-03-29 NOTE — Telephone Encounter (Signed)
Copied from [place] [id number]. Topic: General - Other >> Mar 29, 2018 12:18 PM Cecelia Byars, NT wrote: Reason for CRM: Murray Hodgkins from [facility] called and would like to speak with Mable Paris  or her nurse in regards to the patient ,she is her case manager and would like to follow up on her care concerning her diabetes please call her at [phone] x [phone]

## 2018-03-29 NOTE — Telephone Encounter (Signed)
Left voice mail for patient to call back ok for [facility] to speak to patient    

## 2018-03-31 NOTE — Telephone Encounter (Signed)
Left voice mail for [facility] RN  to call back ok for [facility] to speak to patient

## 2018-04-01 NOTE — Telephone Encounter (Signed)
FYI

## 2018-04-01 NOTE — Telephone Encounter (Signed)
Murray Hodgkins from [facility] calling to report that she is now working with pt on diabetes management.   A1C 6.4 Letter will be sent to the office in regards to this. Murray Hodgkins can be reached at [phone]

## 2018-04-02 NOTE — Telephone Encounter (Signed)
noted 

## 2018-04-30 ENCOUNTER — Encounter: Payer: Self-pay | Admitting: Family

## 2018-04-30 ENCOUNTER — Ambulatory Visit: Payer: 59 | Admitting: Family

## 2018-04-30 VITALS — BP 124/84 | HR 84 | Temp 98.3°F | Resp 15 | Wt 186.1 lb

## 2018-04-30 DIAGNOSIS — Z23 Encounter for immunization: Secondary | ICD-10-CM

## 2018-04-30 DIAGNOSIS — Z720 Tobacco use: Secondary | ICD-10-CM

## 2018-04-30 DIAGNOSIS — E785 Hyperlipidemia, unspecified: Secondary | ICD-10-CM

## 2018-04-30 DIAGNOSIS — E1169 Type 2 diabetes mellitus with other specified complication: Secondary | ICD-10-CM | POA: Insufficient documentation

## 2018-04-30 DIAGNOSIS — E119 Type 2 diabetes mellitus without complications: Secondary | ICD-10-CM

## 2018-04-30 LAB — MICROALBUMIN / CREATININE URINE RATIO
Creatinine,U: 87.3 mg/dL
Microalb Creat Ratio: 0.8 mg/g (ref 0.0–30.0)

## 2018-04-30 MED ORDER — VARENICLINE TARTRATE 0.5 MG X 11 & 1 MG X 42 PO MISC: ORAL | 0 refills | Status: DC

## 2018-04-30 MED ORDER — METFORMIN HCL 500 MG PO TABS
ORAL_TABLET | ORAL | 2 refills | Status: DC
Start: 2018-04-30 — End: 2019-02-02

## 2018-04-30 NOTE — Assessment & Plan Note (Addendum)
LDL > 70. Advised to start statin to lower CVD risk. She prefers lifestyle at this time. Will look at lipid panel at next visit. Pending cardiology referral for risk stratification, family history

## 2018-04-30 NOTE — Progress Notes (Signed)
Subjective:    Patient ID: Regina Barnes, female    DOB: [birth year], 52 y.o.   MRN: [id number]  CC: Regina Barnes is a 52 y.o. female who presents today for follow up and to transfer care from Dr Deborra Medina.   HPI: DM- needs refill of metformin, has been off of metformin. a1c at work 6.4 when off of metformin. No numbness, tingling of feet. No wounds. Has annual eye exam 07/2018.   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   HLD- has beome vegetarian. Family h/o HLD.   Smoker- has done chantix in the past which worked well. No weird thoughts. Had vivid dreams however no nightmares. Would like to try again.    No exercise. No CP. Has never had cardiac evaluation.      HISTORY:  Past Medical History:  Diagnosis Date  . Arthritis    Rheumatoid  . Diabetes mellitus without complication ([place])   . Tobacco abuse    Past Surgical History:  Procedure Laterality Date  . COLONOSCOPY WITH PROPOFOL N/A 12/19/2016   Procedure: COLONOSCOPY WITH PROPOFOL;  Surgeon: Jonathon Bellows, MD;  Location: [facility] ENDOSCOPY;  Service: Endoscopy;  Laterality: N/A;   Family History  Problem Relation Age of Onset  . Heart attack Father 53  . Diabetes Other   . Breast cancer Maternal Aunt        great aunt >50  . Breast cancer Paternal Aunt        great aunt  . Hyperlipidemia Mother     Allergies: Codeine Current Outpatient Medications on File Prior to Visit  Medication Sig Dispense Refill  . Adalimumab (HUMIRA) 10 MG/0.2ML PSKT As directed (Patient taking differently: Inject 10 mg into the skin every 14 (fourteen) days. As directed)    . CALCIUM-MAGNESIUM-ZINC PO Take 2 tablets by mouth daily.    . cholecalciferol (VITAMIN D) 1000 UNITS tablet Take 1,000 Units by mouth daily.    . Cyanocobalamin (HM SUPER VITAMIN B12) 2500 MCG CHEW Chew by mouth daily.    . folic acid (FOLVITE) 1 MG tablet Take 1 mg by mouth daily.    Marland Kitchen glucose blood (ONE TOUCH ULTRA TEST) test strip USE AS INSTRUCTED TO TEST  BLOOD SUGAR ONCE DAILY 100 each 1  . Methotrexate, PF, 25 MG/0.4ML SOAJ Inject 25 mg into the skin once a week. One injection weekly     No current facility-administered medications on file prior to visit.     Social History   Tobacco Use  . Smoking status: Current Every Day Smoker    Packs/day: 0.25    Years: 20.00    Pack years: 5.00    Types: Cigarettes    Last attempt to quit: 05/30/2015    Years since quitting: 2.9  . Smokeless tobacco: Never Used  Substance Use Topics  . Alcohol use: Yes    Alcohol/week: 4.0 standard drinks    Types: 4 Cans of beer per week    Comment: occasionally  . Drug use: No    Review of Systems  Constitutional: Negative for chills and fever.  Respiratory: Negative for cough.   Cardiovascular: Negative for chest pain and palpitations.  Gastrointestinal: Negative for nausea and vomiting.      Objective:    BP 124/84 (BP Location: Left Arm, Patient Position: Sitting, Cuff Size: Normal)   Pulse 84   Temp 98.3 F (36.8 C) (Oral)   Resp 15   Wt 186 lb 2 oz (84.4 kg)   LMP 01/28/2018  SpO2 97%   BMI 29.59 kg/m  BP Readings from Last 3 Encounters:  04/30/18 124/84  07/30/17 114/78  12/19/16 125/81   Wt Readings from Last 3 Encounters:  04/30/18 186 lb 2 oz (84.4 kg)  07/30/17 178 lb 6.4 oz (80.9 kg)  12/19/16 180 lb (81.6 kg)    Physical Exam  Constitutional: She appears well-developed and well-nourished.  Eyes: Conjunctivae are normal.  Cardiovascular: Normal rate, regular rhythm, normal heart sounds and normal pulses.  Pulmonary/Chest: Effort normal and breath sounds normal. She has no wheezes. She has no rhonchi. She has no rales.  Neurological: She is alert.  Skin: Skin is warm and dry.  Psychiatric: She has a normal mood and affect. Her speech is normal and behavior is normal. Thought content normal.  Vitals reviewed.      Assessment & Plan:   Problem List Items Addressed This Visit      Endocrine   Diabetes mellitus,  new onset ([place]) - Primary (Chronic)    A1c at goal, 6.4. Will continue metformin for now; advised she may be able to come off at some point. Will await f/u a1c.       Relevant Medications   metFORMIN (GLUCOPHAGE) 500 MG tablet   Other Relevant Orders   Ambulatory referral to Cardiology   Microalbumin / creatinine urine ratio     Other   Tobacco abuse    Start chantix. F/u with pharmacisit Catie for continued support, education. Will follow      Relevant Medications   varenicline (CHANTIX STARTING MONTH PAK) 0.5 MG X 11 & 1 MG X 42 tablet   Other Relevant Orders   Ambulatory referral to Cardiology   Hyperlipidemia    LDL > 70. Advised to start statin to lower CVD risk. She prefers lifestyle at this time. Will look at lipid panel at next visit. Pending cardiology referral for risk stratification, family history          I am having Flower Franko. Fruge start on varenicline. I am also having her maintain her Methotrexate (PF), folic acid, Adalimumab, cholecalciferol, CALCIUM-MAGNESIUM-ZINC PO, glucose blood, Cyanocobalamin, and metFORMIN.   Meds ordered this encounter  Medications  . varenicline (CHANTIX STARTING MONTH PAK) 0.5 MG X 11 & 1 MG X 42 tablet    Sig: Take one 0.5 mg tablet by mouth once daily for 3 days, then increase to one 0.5 mg tablet twice daily for 4 days, then increase to one 1 mg tablet twice daily.    Dispense:  53 tablet    Refill:  0    Order Specific Question:   Supervising Provider    Answer:   Deborra Medina L [[id number]]  . metFORMIN (GLUCOPHAGE) 500 MG tablet    Sig: TAKE 1 TABLET BY MOUTH  DAILY WITH SUPPER    Dispense:  90 tablet    Refill:  2    Order Specific Question:   Supervising Provider    Answer:   Crecencio Mc [[id number]]    Return precautions given.   Risks, benefits, and alternatives of the medications and treatment plan prescribed today were discussed, and patient expressed understanding.   Education regarding symptom management and  diagnosis given to patient on AVS.  Continue to follow with Burnard Hawthorne, FNP for routine health maintenance.   Agustina Caroli and I agreed with plan.   Mable Paris, FNP

## 2018-04-30 NOTE — Assessment & Plan Note (Signed)
Start chantix. F/u with pharmacisit Catie for continued support, education. Will follow

## 2018-04-30 NOTE — Patient Instructions (Addendum)
Appointment with Catie the pharmacist for smoking cessation  Today we discussed referrals, orders. Cardiology   I have placed these orders in the system for you.  Please be sure to give [place] a call if you have not heard from our office regarding scheduling a test or regarding referral in a timely manner.  It is very important that you let me know as soon as possible.   Let me know about tetanus vaccine  Pleasure meeting you

## 2018-04-30 NOTE — Assessment & Plan Note (Signed)
A1c at goal, 6.4. Will continue metformin for now; advised she may be able to come off at some point. Will await f/u a1c.

## 2018-06-14 ENCOUNTER — Encounter: Payer: Self-pay | Admitting: Family

## 2018-06-14 ENCOUNTER — Ambulatory Visit: Payer: 59 | Admitting: Family

## 2018-06-14 VITALS — BP 130/90 | HR 83 | Temp 98.7°F | Resp 15 | Ht 66.5 in | Wt 184.0 lb

## 2018-06-14 DIAGNOSIS — R519 Headache, unspecified: Secondary | ICD-10-CM | POA: Insufficient documentation

## 2018-06-14 DIAGNOSIS — K219 Gastro-esophageal reflux disease without esophagitis: Secondary | ICD-10-CM | POA: Diagnosis not present

## 2018-06-14 DIAGNOSIS — R51 Headache: Secondary | ICD-10-CM

## 2018-06-14 MED ORDER — DICLOFENAC SODIUM 1 % TD GEL: 4.0000 g | Freq: Four times a day (QID) | TRANSDERMAL | 3 refills | Status: DC

## 2018-06-14 MED ORDER — CYCLOBENZAPRINE HCL 5 MG PO TABS: ORAL_TABLET | ORAL | 1 refills | Status: DC

## 2018-06-14 NOTE — Patient Instructions (Addendum)
Ensure you have AT LEAST 2 hours  Prior to laying down to sleep to prevent reflux.   Look into pepcid [facility] and take consistently tiwce per day for a couple of weeks - let me know if symptoms dont improve , we need to consult GI for upper scope.   Trial flexeril , voltaren gel as suspect muscle spasm, neck pain contributory  Call me immediately if headache doesn't improve or new features develop as we discussed- I would recommend MRI Brain at that point .   General Headache Without Cause A headache is pain or discomfort felt around the head or neck area. The specific cause of a headache may not be found. There are many causes and types of headaches. A few common ones are:  Tension headaches.  Migraine headaches.  Cluster headaches.  Chronic daily headaches.  Follow these instructions at home: Watch your condition for any changes. Take these steps to help with your condition: Managing pain  Take over-the-counter and prescription medicines only as told by your health care provider.  Lie down in a dark, quiet room when you have a headache.  If directed, apply ice to the head and neck area: ? Put ice in a plastic bag. ? Place a towel between your skin and the bag. ? Leave the ice on for 20 minutes, 2-3 times per day.  Use a heating pad or hot shower to apply heat to the head and neck area as told by your health care provider.  Keep lights dim if bright lights bother you or make your headaches worse. Eating and drinking  Eat meals on a regular schedule.  Limit alcohol use.  Decrease the amount of caffeine you drink, or stop drinking caffeine. General instructions  Keep all follow-up visits as told by your health care provider. This is important.  Keep a headache journal to help find out what may trigger your headaches. For example, write down: ? What you eat and drink. ? How much sleep you get. ? Any change to your diet or medicines.  Try massage or other relaxation  techniques.  Limit stress.  Sit up straight, and do not tense your muscles.  Do not use tobacco products, including cigarettes, chewing tobacco, or e-cigarettes. If you need help quitting, ask your health care provider.  Exercise regularly as told by your health care provider.  Sleep on a regular schedule. Get 7-9 hours of sleep, or the amount recommended by your health care provider. Contact a health care provider if:  Your symptoms are not helped by medicine.  You have a headache that is different from the usual headache.  You have nausea or you vomit.  You have a fever. Get help right away if:  Your headache becomes severe.  You have repeated vomiting.  You have a stiff neck.  You have a loss of vision.  You have problems with speech.  You have pain in the eye or ear.  You have muscular weakness or loss of muscle control.  You lose your balance or have trouble walking.  You feel faint or pass out.  You have confusion. This information is not intended to replace advice given to you by your health care provider. Make sure you discuss any questions you have with your health care provider. Document Released: 08/11/2005 Document Revised: 01/17/2016 Document Reviewed: 12/04/2014 Elsevier Interactive Patient Education  [employer].

## 2018-06-14 NOTE — Assessment & Plan Note (Signed)
Reassuring neurologic exam.  This is not worse headache of patient's life.  I do agree with patient  That it seems to originate potentially from the stiffness in her neck?  possibly from psoriatic arthritis.  She politely declines MRI of the brain today as she would first like to try conservative therapy at home.  I agree with this as long as she continues very close vigilance and will let me know if any new or worsening symptoms.  We will do a trial of Flexeril, Voltaren gel, moist heat;  if no improvement, patient will let me know.

## 2018-06-14 NOTE — Assessment & Plan Note (Signed)
Symptoms most consistent with acid reflux.  Will try Pepcid AC over-the-counter.  Education provided on lifestyle measures as well.  If no improvement, patient let me know ; since she is a former smoker, I would advise GI consult with EGD.  Patient politely declines at this time  And will try medication first.  Will follow

## 2018-06-14 NOTE — Progress Notes (Signed)
Subjective:    Patient ID: Regina Barnes, female    DOB: [birth year], 52 y.o.   MRN: [id number]  CC: Regina Barnes is a 52 y.o. female who presents today for follow up.   HPI:  HA x one month, overall worsening.   Not worse HA of  Life, doesn't awaken from sleep.   HA is mostly in 'base of skull.' Describes as 'puffy, inflamed' feeling at base of neck and bilateral cheeks of face. Pain is constant spasm, radiating to right upper shoulder. No sharp or stabbing pain.   No vision changes, fever, numbness, chest pain, sob, dizziness, syncope.   HA worsens in the middle of the day , which makes her think it is her arthritis.  Has taken ibuprofen with some relief, not complete. Using ibuprofen QOD.   Drinks  1 cup of caffeine/day. Sleeping well.   Psoriatic arthritis-  With [facility];  not on humira and' felt great' for a while, stopped 4 months ago. Then she noticed neck pain.  Started 12 day prednisone taper 3 weeks ago while working on getting on embril .  Has remained on methotrexate.   Prednisone helped the neck pain however 'underlining HA' never went away.   DM- a1c 6.4 02/2018  Has Quit Smoking.   Also complains that when eats has trouble with it 'going down'. No weight loss, early satiety, Endorses epigastric pain 'only at night when laying down'. No bloating, trouble swallowing, increase gas, cough. Tums with relief. Never had EGD. UTD colonoscopy.   H/o smoking.     HISTORY:  Past Medical History:  Diagnosis Date  . Arthritis    Rheumatoid  . Diabetes mellitus without complication ([place])   . Tobacco abuse    Past Surgical History:  Procedure Laterality Date  . COLONOSCOPY WITH PROPOFOL N/A 12/19/2016   Procedure: COLONOSCOPY WITH PROPOFOL;  Surgeon: Jonathon Bellows, MD;  Location: [facility] ENDOSCOPY;  Service: Endoscopy;  Laterality: N/A;   Family History  Problem Relation Age of Onset  . Heart attack Father 42  . Diabetes Other   . Breast cancer Maternal Aunt         great aunt >50  . Breast cancer Paternal Aunt        great aunt  . Hyperlipidemia Mother     Allergies: Codeine Current Outpatient Medications on File Prior to Visit  Medication Sig Dispense Refill  . CALCIUM-MAGNESIUM-ZINC PO Take 2 tablets by mouth daily.    . cholecalciferol (VITAMIN D) 1000 UNITS tablet Take 1,000 Units by mouth daily.    . Cyanocobalamin (HM SUPER VITAMIN B12) 2500 MCG CHEW Chew by mouth daily.    . folic acid (FOLVITE) 1 MG tablet Take 1 mg by mouth daily.    Marland Kitchen glucose blood (ONE TOUCH ULTRA TEST) test strip USE AS INSTRUCTED TO TEST BLOOD SUGAR ONCE DAILY 100 each 1  . metFORMIN (GLUCOPHAGE) 500 MG tablet TAKE 1 TABLET BY MOUTH  DAILY WITH SUPPER 90 tablet 2  . Methotrexate, PF, 25 MG/0.4ML SOAJ Inject 25 mg into the skin once a week. One injection weekly    . varenicline (CHANTIX STARTING MONTH PAK) 0.5 MG X 11 & 1 MG X 42 tablet Take one 0.5 mg tablet by mouth once daily for 3 days, then increase to one 0.5 mg tablet twice daily for 4 days, then increase to one 1 mg tablet twice daily. 53 tablet 0  . Adalimumab (HUMIRA) 10 MG/0.2ML PSKT As directed (Patient not taking: Reported  on 06/14/2018)     No current facility-administered medications on file prior to visit.     Social History   Tobacco Use  . Smoking status: Former Smoker    Packs/day: 0.25    Years: 20.00    Pack years: 5.00    Types: Cigarettes    Last attempt to quit: 05/30/2015    Years since quitting: 3.0  . Smokeless tobacco: Never Used  Substance Use Topics  . Alcohol use: Yes    Alcohol/week: 4.0 standard drinks    Types: 4 Cans of beer per week    Comment: occasionally  . Drug use: No    Review of Systems  Constitutional: Negative for chills, fever and unexpected weight change.  HENT: Negative for congestion and trouble swallowing.   Respiratory: Negative for cough.   Cardiovascular: Negative for chest pain and palpitations.  Gastrointestinal: Negative for abdominal pain,  constipation, diarrhea, nausea and vomiting.  Musculoskeletal: Positive for neck pain and neck stiffness. Negative for back pain.  Neurological: Positive for headaches. Negative for dizziness, facial asymmetry and numbness.      Objective:    BP 130/90 (BP Location: Left Arm, Patient Position: Sitting, Cuff Size: Normal)   Pulse 83   Temp 98.7 F (37.1 C) (Oral)   Resp 15   Ht 5' 6.5" (1.689 m)   Wt 184 lb (83.5 kg)   SpO2 97%   BMI 29.25 kg/m  BP Readings from Last 3 Encounters:  06/14/18 130/90  04/30/18 124/84  07/30/17 114/78   Wt Readings from Last 3 Encounters:  06/14/18 184 lb (83.5 kg)  04/30/18 186 lb 2 oz (84.4 kg)  07/30/17 178 lb 6.4 oz (80.9 kg)    Physical Exam  Constitutional: She appears well-developed and well-nourished.  HENT:  Head: Normocephalic and atraumatic.  Right Ear: Hearing, tympanic membrane, external ear and ear canal normal. No drainage, swelling or tenderness. No foreign bodies. Tympanic membrane is not erythematous and not bulging. No middle ear effusion. No decreased hearing is noted.  Left Ear: Hearing, tympanic membrane, external ear and ear canal normal. No drainage, swelling or tenderness. No foreign bodies. Tympanic membrane is not erythematous and not bulging.  No middle ear effusion. No decreased hearing is noted.  Nose: Nose normal. No rhinorrhea. Right sinus exhibits no maxillary sinus tenderness and no frontal sinus tenderness. Left sinus exhibits no maxillary sinus tenderness and no frontal sinus tenderness.  Mouth/Throat: Uvula is midline, oropharynx is clear and moist and mucous membranes are normal. No oropharyngeal exudate, posterior oropharyngeal edema, posterior oropharyngeal erythema or tonsillar abscesses.  No scalp tenderness. No tenderness noted with palpation of temporal area.   Eyes: Pupils are equal, round, and reactive to light. Conjunctivae and EOM are normal.  Fundus normal bilaterally.   Neck: Spinous process  tenderness and muscular tenderness present. Decreased range of motion present.    Tenderness noted with palpation. Decreased ROM with side to side and front to back movements. No rash.   Cardiovascular: Normal rate, regular rhythm, normal heart sounds and normal pulses.  Pulmonary/Chest: Effort normal and breath sounds normal. She has no wheezes. She has no rhonchi. She has no rales.  Abdominal: Soft. Normal appearance and bowel sounds are normal. She exhibits no distension, no fluid wave, no ascites and no mass. There is no tenderness. There is no rigidity, no rebound, no guarding and no CVA tenderness.  Lymphadenopathy:       Head (right side): No submental, no submandibular, no tonsillar, no preauricular,  no posterior auricular and no occipital adenopathy present.       Head (left side): No submental, no submandibular, no tonsillar, no preauricular, no posterior auricular and no occipital adenopathy present.    She has no cervical adenopathy.  Neurological: She is alert. She has normal strength. No cranial nerve deficit or sensory deficit. She displays a negative Romberg sign.  Reflex Scores:      Bicep reflexes are 2+ on the right side and 2+ on the left side.      Patellar reflexes are 2+ on the right side and 2+ on the left side. Grip equal and strong bilateral upper extremities. Gait strong and steady. Able to perform rapid alternating movement without difficulty.   Skin: Skin is warm and dry.  Psychiatric: She has a normal mood and affect. Her speech is normal and behavior is normal. Thought content normal.  Vitals reviewed.      Assessment & Plan:   Problem List Items Addressed This Visit      Digestive   Gastroesophageal reflux disease    Symptoms most consistent with acid reflux.  Will try Pepcid AC over-the-counter.  Education provided on lifestyle measures as well.  If no improvement, patient let me know ; since she is a former smoker, I would advise GI consult with EGD.   Patient politely declines at this time  And will try medication first.  Will follow        Other   Nonintractable headache - Primary    Reassuring neurologic exam.  This is not worse headache of patient's life.  I do agree with patient  That it seems to originate potentially from the stiffness in her neck?  possibly from psoriatic arthritis.  She politely declines MRI of the brain today as she would first like to try conservative therapy at home.  I agree with this as long as she continues very close vigilance and will let me know if any new or worsening symptoms.  We will do a trial of Flexeril, Voltaren gel, moist heat;  if no improvement, patient will let me know.      Relevant Medications   cyclobenzaprine (FLEXERIL) 5 MG tablet   diclofenac sodium (VOLTAREN) 1 % GEL       I am having Regina Barnes. Keep start on cyclobenzaprine and diclofenac sodium. I am also having her maintain her Methotrexate (PF), folic acid, Adalimumab, cholecalciferol, CALCIUM-MAGNESIUM-ZINC PO, glucose blood, Cyanocobalamin, varenicline, and metFORMIN.   Meds ordered this encounter  Medications  . cyclobenzaprine (FLEXERIL) 5 MG tablet    Sig: Take one to two tablets at bedtime as needed for muscle spasm.    Dispense:  30 tablet    Refill:  1    Order Specific Question:   Supervising Provider    Answer:   Deborra Medina L [[id number]]  . diclofenac sodium (VOLTAREN) 1 % GEL    Sig: Apply 4 g topically 4 (four) times daily.    Dispense:  1 Tube    Refill:  3    Order Specific Question:   Supervising Provider    Answer:   Crecencio Mc [[id number]]    Return precautions given.   Risks, benefits, and alternatives of the medications and treatment plan prescribed today were discussed, and patient expressed understanding.   Education regarding symptom management and diagnosis given to patient on AVS.  Continue to follow with Burnard Hawthorne, FNP for routine health maintenance.   Agustina Caroli and I agreed with  plan.   Margaret Arnett, FNP   

## 2018-06-30 ENCOUNTER — Ambulatory Visit: Payer: 59 | Admitting: Internal Medicine

## 2018-06-30 NOTE — Progress Notes (Deleted)
New Outpatient Visit Date: 06/30/2018  Referring Provider: Burnard Hawthorne, FNP [address] [place], [place] [address]  Chief Complaint: ***  HPI:  Regina Barnes is a 52 y.o. female who is being seen today for the evaluation of cardiovascular risk at the request of Ms. Arnett. She has a history of diabetes mellitus, rheumatoid arthritis, and tobacco use. ***  --------------------------------------------------------------------------------------------------  Cardiovascular History & Procedures: Cardiovascular Problems:  None  Risk Factors:  Diabetes mellitus, tobacco use, and family history  Cath/PCI:  None  CV Surgery:  None  EP Procedures and Devices:  None  Non-Invasive Evaluation(s):  None  Recent CV Pertinent Labs: Lab Results  Component Value Date   CHOL 238 (H) 10/14/2016   HDL 74 10/14/2016   LDLCALC 142 (H) 10/14/2016   TRIG 109 10/14/2016   CHOLHDL 3.2 10/14/2016   K 4.9 10/14/2016   BUN 10 10/14/2016   CREATININE 0.81 10/14/2016    --------------------------------------------------------------------------------------------------  Past Medical History:  Diagnosis Date  . Arthritis    Rheumatoid  . Diabetes mellitus without complication ([place])   . Tobacco abuse     Past Surgical History:  Procedure Laterality Date  . COLONOSCOPY WITH PROPOFOL N/A 12/19/2016   Procedure: COLONOSCOPY WITH PROPOFOL;  Surgeon: Jonathon Bellows, MD;  Location: [facility] ENDOSCOPY;  Service: Endoscopy;  Laterality: N/A;    No outpatient medications have been marked as taking for the 06/30/18 encounter (Appointment) with Nakhi Choi, Harrell Gave, MD.    Allergies: Codeine  Social History   Tobacco Use  . Smoking status: Former Smoker    Packs/day: 0.25    Years: 20.00    Pack years: 5.00    Types: Cigarettes    Last attempt to quit: 05/30/2015    Years since quitting: 3.0  . Smokeless tobacco: Never Used  Substance Use Topics  . Alcohol use: Yes   Alcohol/week: 4.0 standard drinks    Types: 4 Cans of beer per week    Comment: occasionally  . Drug use: No    Family History  Problem Relation Age of Onset  . Heart attack Father 1  . Diabetes Other   . Breast cancer Maternal Aunt        great aunt >50  . Breast cancer Paternal Aunt        great aunt  . Hyperlipidemia Mother     Review of Systems: A 12-system review of systems was performed and was negative except as noted in the HPI.  --------------------------------------------------------------------------------------------------  Physical Exam: There were no vitals taken for this visit.  General:  *** HEENT: No conjunctival pallor or scleral icterus. Moist mucous membranes. OP clear. Neck: Supple without lymphadenopathy, thyromegaly, JVD, or HJR. No carotid bruit. Lungs: Normal work of breathing. Clear to auscultation bilaterally without wheezes or crackles. Heart: Regular rate and rhythm without murmurs, rubs, or gallops. Non-displaced PMI. Abd: Bowel sounds present. Soft, NT/ND without hepatosplenomegaly Ext: No lower extremity edema. Radial, PT, and DP pulses are 2+ bilaterally Skin: Warm and dry without rash. Neuro: CNIII-XII intact. Strength and fine-touch sensation intact in upper and lower extremities bilaterally. Psych: Normal mood and affect.  EKG:  ***  Lab Results  Component Value Date   WBC 10.7 10/14/2016   HGB 12.8 10/14/2016   HCT 39.5 10/14/2016   MCV 95 10/14/2016   PLT 262 10/14/2016    Lab Results  Component Value Date   NA 144 10/14/2016   K 4.9 10/14/2016   CL 102 10/14/2016   CO2 25  10/14/2016   BUN 10 10/14/2016   CREATININE 0.81 10/14/2016   GLUCOSE 130 (H) 10/14/2016   ALT 26 10/14/2016    Lab Results  Component Value Date   CHOL 238 (H) 10/14/2016   HDL 74 10/14/2016   LDLCALC 142 (H) 10/14/2016   TRIG 109 10/14/2016   CHOLHDL 3.2 10/14/2016      --------------------------------------------------------------------------------------------------  ASSESSMENT AND PLAN: Regina Bush, MD 06/30/2018 7:21 AM

## 2018-07-29 ENCOUNTER — Telehealth: Payer: Self-pay | Admitting: Radiology

## 2018-07-29 NOTE — Telephone Encounter (Signed)
Pt is coming in for labs tomorrow, please place future orders. Thank you.

## 2018-07-30 ENCOUNTER — Telehealth: Payer: Self-pay

## 2018-07-30 ENCOUNTER — Other Ambulatory Visit: Payer: Self-pay

## 2018-07-30 ENCOUNTER — Other Ambulatory Visit (INDEPENDENT_AMBULATORY_CARE_PROVIDER_SITE_OTHER): Payer: 59

## 2018-07-30 DIAGNOSIS — Z01419 Encounter for gynecological examination (general) (routine) without abnormal findings: Secondary | ICD-10-CM

## 2018-07-30 DIAGNOSIS — E785 Hyperlipidemia, unspecified: Secondary | ICD-10-CM

## 2018-07-30 DIAGNOSIS — M2550 Pain in unspecified joint: Secondary | ICD-10-CM

## 2018-07-30 DIAGNOSIS — R202 Paresthesia of skin: Secondary | ICD-10-CM

## 2018-07-30 DIAGNOSIS — E119 Type 2 diabetes mellitus without complications: Secondary | ICD-10-CM

## 2018-07-30 DIAGNOSIS — E559 Vitamin D deficiency, unspecified: Secondary | ICD-10-CM

## 2018-07-30 DIAGNOSIS — R5383 Other fatigue: Secondary | ICD-10-CM

## 2018-07-30 NOTE — Telephone Encounter (Signed)
Labs were ordered

## 2018-07-31 LAB — MICROALBUMIN / CREATININE URINE RATIO
Creatinine, Urine: 119.2 mg/dL
MICROALB/CREAT RATIO: 10.7 mg/g{creat} (ref 0.0–30.0)
Microalbumin, Urine: 12.8 ug/mL

## 2018-08-02 LAB — TSH: TSH: 3 u[IU]/mL (ref 0.450–4.500)

## 2018-08-02 LAB — COMPREHENSIVE METABOLIC PANEL
ALBUMIN: 4.7 g/dL (ref 3.5–5.5)
ALT: 35 IU/L — ABNORMAL HIGH (ref 0–32)
AST: 24 IU/L (ref 0–40)
Albumin/Globulin Ratio: 2.2 (ref 1.2–2.2)
Alkaline Phosphatase: 116 IU/L (ref 39–117)
BUN/Creatinine Ratio: 9 (ref 9–23)
BUN: 8 mg/dL (ref 6–24)
Bilirubin Total: 0.3 mg/dL (ref 0.0–1.2)
CHLORIDE: 105 mmol/L (ref 96–106)
CO2: 24 mmol/L (ref 20–29)
Calcium: 10 mg/dL (ref 8.7–10.2)
Creatinine, Ser: 0.86 mg/dL (ref 0.57–1.00)
GFR calc non Af Amer: 78 mL/min/{1.73_m2} (ref >59)
GFR, EST AFRICAN AMERICAN: 90 mL/min/{1.73_m2} (ref >59)
GLUCOSE: 198 mg/dL — AB (ref 65–99)
Globulin, Total: 2.1 g/dL (ref 1.5–4.5)
Potassium: 4.6 mmol/L (ref 3.5–5.2)
Sodium: 144 mmol/L (ref 134–144)
Total Protein: 6.8 g/dL (ref 6.0–8.5)

## 2018-08-02 LAB — LIPID PANEL
Chol/HDL Ratio: 2.7 ratio (ref 0.0–4.4)
Cholesterol, Total: 243 mg/dL — ABNORMAL HIGH (ref 100–199)
HDL: 91 mg/dL (ref >39)
LDL Calculated: 134 mg/dL — ABNORMAL HIGH (ref 0–99)
Triglycerides: 89 mg/dL (ref 0–149)
VLDL Cholesterol Cal: 18 mg/dL (ref 5–40)

## 2018-08-02 LAB — HEMOGLOBIN A1C
Est. average glucose Bld gHb Est-mCnc: 154 mg/dL
Hgb A1c MFr Bld: 7 % — ABNORMAL HIGH (ref 4.8–5.6)

## 2018-08-02 LAB — CBC WITH DIFFERENTIAL/PLATELET
Basophils Absolute: 0 10*3/uL (ref 0.0–0.2)
Basos: 0 %
EOS (ABSOLUTE): 0.1 10*3/uL (ref 0.0–0.4)
Eos: 1 %
HEMOGLOBIN: 12.4 g/dL (ref 11.1–15.9)
Hematocrit: 37.2 % (ref 34.0–46.6)
IMMATURE GRANS (ABS): 0 10*3/uL (ref 0.0–0.1)
Immature Granulocytes: 0 %
LYMPHS: 16 %
Lymphocytes Absolute: 1.8 10*3/uL (ref 0.7–3.1)
MCH: 30.9 pg (ref 26.6–33.0)
MCHC: 33.3 g/dL (ref 31.5–35.7)
MCV: 93 fL (ref 79–97)
Monocytes Absolute: 0.7 10*3/uL (ref 0.1–0.9)
Monocytes: 6 %
Neutrophils Absolute: 8.4 10*3/uL — ABNORMAL HIGH (ref 1.4–7.0)
Neutrophils: 77 %
Platelets: 275 10*3/uL (ref 150–450)
RBC: 4.01 x10E6/uL (ref 3.77–5.28)
RDW: 12.8 % (ref 12.3–15.4)
WBC: 11 10*3/uL — ABNORMAL HIGH (ref 3.4–10.8)

## 2018-08-02 LAB — VITAMIN D 25 HYDROXY (VIT D DEFICIENCY, FRACTURES): Vit D, 25-Hydroxy: 30.7 ng/mL (ref 30.0–100.0)

## 2018-08-02 LAB — VITAMIN B12: Vitamin B-12: 791 pg/mL (ref 232–1245)

## 2018-08-02 NOTE — Addendum Note (Signed)
Addended by: Burnard Hawthorne on: 08/02/2018 01:39 PM   Modules accepted: Orders

## 2018-10-06 ENCOUNTER — Ambulatory Visit: Payer: Managed Care, Other (non HMO) | Admitting: Internal Medicine

## 2018-10-06 ENCOUNTER — Encounter: Payer: Self-pay | Admitting: Internal Medicine

## 2018-10-06 VITALS — BP 146/90 | HR 77 | Ht 67.0 in | Wt 188.0 lb

## 2018-10-06 DIAGNOSIS — E119 Type 2 diabetes mellitus without complications: Secondary | ICD-10-CM | POA: Diagnosis not present

## 2018-10-06 DIAGNOSIS — Z7189 Other specified counseling: Secondary | ICD-10-CM

## 2018-10-06 DIAGNOSIS — Z79899 Other long term (current) drug therapy: Secondary | ICD-10-CM

## 2018-10-06 DIAGNOSIS — E7800 Pure hypercholesterolemia, unspecified: Secondary | ICD-10-CM | POA: Diagnosis not present

## 2018-10-06 MED ORDER — ROSUVASTATIN CALCIUM 10 MG PO TABS: 10.0000 mg | ORAL_TABLET | Freq: Every day | ORAL | 3 refills | Status: DC

## 2018-10-06 NOTE — Progress Notes (Signed)
New Outpatient Visit Date: 10/06/2018  Referring Provider: Burnard Hawthorne, FNP [address] [place],  [address]  Chief Complaint: Cardiac risk assessment  HPI:  Regina Barnes is a 53 y.o. female who is being seen today for risk stratification of coronary artery disease at the request of Regina Barnes. She has a history of diabetes mellitus, hyperlipidemia, rheumatoid arthritis, tobacco use, and family history of premature coronary artery disease (father died of massive "heart attack" at age 33).  Today, Regina Barnes feels well, denying chest pain, shortness of breath, palpitations, lightheadedness, orthopnea, PND, and edema.  She happily reports that she stopped smoking in October with the assistance of October.  She also became a vegetarian last summer.  She notes a fair amount of stress at her job.  She has never undergone cardiac testing.  --------------------------------------------------------------------------------------------------  Cardiovascular History & Procedures: Cardiovascular Problems:  Multiple cardiovascular risk factors  Risk Factors:  Hyperlipidemia, diabetes mellitus, tobacco use, and family history  Cath/PCI:  None  CV Surgery:  None  EP Procedures and Devices:  None  Non-Invasive Evaluation(s):  None  Recent CV Pertinent Labs: Lab Results  Component Value Date   CHOL 243 (H) 07/30/2018   HDL 91 07/30/2018   LDLCALC 134 (H) 07/30/2018   TRIG 89 07/30/2018   CHOLHDL 2.7 07/30/2018   K 4.6 07/30/2018   BUN 8 07/30/2018   CREATININE 0.86 07/30/2018    --------------------------------------------------------------------------------------------------  Past Medical History:  Diagnosis Date  . Arthritis    Rheumatoid  . Diabetes mellitus without complication ([place])   . Hyperlipidemia   . Tobacco abuse     Past Surgical History:  Procedure Laterality Date  . COLONOSCOPY WITH PROPOFOL N/A 12/19/2016   Procedure: COLONOSCOPY  WITH PROPOFOL;  Surgeon: Jonathon Bellows, MD;  Location: [facility] ENDOSCOPY;  Service: Endoscopy;  Laterality: N/A;    Current Meds  Medication Sig  . Adalimumab (HUMIRA) 10 MG/0.2ML PSKT As directed  . CALCIUM-MAGNESIUM-ZINC PO Take 2 tablets by mouth daily.  . cholecalciferol (VITAMIN D) 1000 UNITS tablet Take 1,000 Units by mouth daily.  . Cyanocobalamin (HM SUPER VITAMIN B12) 2500 MCG CHEW Chew by mouth daily.  . cyclobenzaprine (FLEXERIL) 5 MG tablet Take one to two tablets at bedtime as needed for muscle spasm.  . diclofenac sodium (VOLTAREN) 1 % GEL Apply 4 g topically 4 (four) times daily.  . folic acid (FOLVITE) 1 MG tablet Take 1 mg by mouth daily.  Marland Kitchen glucose blood (ONE TOUCH ULTRA TEST) test strip USE AS INSTRUCTED TO TEST BLOOD SUGAR ONCE DAILY  . metFORMIN (GLUCOPHAGE) 500 MG tablet TAKE 1 TABLET BY MOUTH  DAILY WITH SUPPER  . Methotrexate, PF, 25 MG/0.4ML SOAJ Inject 25 mg into the skin once a week. One injection weekly  . varenicline (CHANTIX STARTING MONTH PAK) 0.5 MG X 11 & 1 MG X 42 tablet Take one 0.5 mg tablet by mouth once daily for 3 days, then increase to one 0.5 mg tablet twice daily for 4 days, then increase to one 1 mg tablet twice daily.  Morrie Sheldon XR 11 MG TB24     Allergies: Codeine  Social History   Tobacco Use  . Smoking status: Former Smoker    Packs/day: 0.25    Years: 20.00    Pack years: 5.00    Types: Cigarettes    Last attempt to quit: 05/30/2015    Years since quitting: 3.3  . Smokeless tobacco: Never Used  Substance Use Topics  . Alcohol  use: Yes    Alcohol/week: 4.0 standard drinks    Types: 4 Cans of beer per week    Comment: occasionally  . Drug use: No    Family History  Problem Relation Age of Onset  . Heart attack Father 82  . Diabetes Other   . Breast cancer Maternal Aunt        great aunt >50  . Breast cancer Paternal Aunt        great aunt  . Hyperlipidemia Mother     Review of Systems: A 12-system review of systems was  performed and was negative except as noted in the HPI.  --------------------------------------------------------------------------------------------------  Physical Exam: BP (!) 146/90 (BP Location: Right Arm, Patient Position: Sitting, Cuff Size: Normal)   Pulse 77   Ht 5\' 7"  (1.702 m)   Wt 188 lb (85.3 kg)   BMI 29.44 kg/m   General:  NAD. HEENT: No conjunctival pallor or scleral icterus. Moist mucous membranes. OP clear. Neck: Supple without lymphadenopathy, thyromegaly, JVD, or HJR. No carotid bruit. Lungs: Normal work of breathing. Clear to auscultation bilaterally without wheezes or crackles. Heart: Regular rate and rhythm without murmurs, rubs, or gallops. Non-displaced PMI. Abd: Bowel sounds present. Soft, NT/ND without hepatosplenomegaly Ext: No lower extremity edema. Radial, PT, and DP pulses are 2+ bilaterally Skin: Warm and dry without rash. Neuro: CNIII-XII intact. Strength and fine-touch sensation intact in upper and lower extremities bilaterally. Psych: Normal mood and affect.  EKG:  NSR without abnormalities.  Lab Results  Component Value Date   WBC 11.0 (H) 07/30/2018   HGB 12.4 07/30/2018   HCT 37.2 07/30/2018   MCV 93 07/30/2018   PLT 275 07/30/2018    Lab Results  Component Value Date   NA 144 07/30/2018   K 4.6 07/30/2018   CL 105 07/30/2018   CO2 24 07/30/2018   BUN 8 07/30/2018   CREATININE 0.86 07/30/2018   GLUCOSE 198 (H) 07/30/2018   ALT 35 (H) 07/30/2018    Lab Results  Component Value Date   CHOL 243 (H) 07/30/2018   HDL 91 07/30/2018   LDLCALC 134 (H) 07/30/2018   TRIG 89 07/30/2018   CHOLHDL 2.7 07/30/2018     --------------------------------------------------------------------------------------------------  ASSESSMENT AND PLAN: Hyperlipidemia and cardiovascular risk assessment Regina Barnes does not have any symptoms to suggesting significant CAD.  She has multiple cardiac risk factors, including hyperlipidemia, diabetes  mellitus, and family history of premature ASCVD.  Rheumatoid arthritis likely also increases her risk for cardiovascular disease, though quantifying this is challenging.  We discussed coronary calcium scoring to provide further risk stratification to guide therapy.  However, in light of her diabetes mellitus, I have recommended adding a statin to achieve an LDL < 100.  We will therefore start rosuvastatin 10 mg daily, with repeat lipid panel and ALT in ~3 months.  We also discussed lifestyle modifications, including diet and exercise.  I congratulated her on quitting smoking and also encouraged her to minimize her alcohol intake.  Diabetes mellitus Continue metformin and work on lifestyle modifications.  Ongoing monitoring per Regina Barnes.  Elevated blood pressure Regina Barnes does not have a history of hypertension, though today's reading is elevated.  If her BP is > 130/80 at future visits, pharmacotherapy will need to be considered.  Follow-up: Return to clinic in 1 year.  Nelva Bush, MD 10/06/2018 4:34 PM

## 2018-10-06 NOTE — Patient Instructions (Signed)
Medication Instructions:  Your physician has recommended you make the following change in your medication:  1- START Crestor 10 mg by mouth once a day.  If you need a refill on your cardiac medications before your next appointment, please call your pharmacy.   Lab work: Your physician recommends that you return for lab work in: [place]. LIPID/ALT Please go to the [facility]. You will check in at the front desk to the right as you walk into the atrium. Valet Parking is offered if needed. - You will need to be fasting.  - ON OR AROUND Jan 04, 2019.  If you have labs (blood work) drawn today and your tests are completely normal, you will receive your results only by: Marland Kitchen MyChart Message (if you have MyChart) OR . A paper copy in the mail If you have any lab test that is abnormal or we need to change your treatment, we will call you to review the results.  Testing/Procedures: none  Follow-Up: At [facility], you and your health needs are our priority.  As part of our continuing mission to provide you with exceptional heart care, we have created designated Provider Care Teams.  These Care Teams include your primary Cardiologist (physician) and Advanced Practice Providers (APPs -  Physician Assistants and Nurse Practitioners) who all work together to provide you with the care you need, when you need it. You will need a follow up appointment in 12 MONTHS.  Please call our office 2 months in advance to schedule this appointment.  You may see DR Harrell Gave END or one of the following Advanced Practice Providers on your designated Care Team:   Murray Hodgkins, NP Christell Faith, PA-C . Marrianne Mood, PA-C

## 2018-10-07 ENCOUNTER — Encounter: Payer: Self-pay | Admitting: Internal Medicine

## 2018-10-07 DIAGNOSIS — E119 Type 2 diabetes mellitus without complications: Secondary | ICD-10-CM | POA: Insufficient documentation

## 2018-10-13 ENCOUNTER — Ambulatory Visit: Payer: 59 | Admitting: Cardiovascular Disease

## 2018-11-22 ENCOUNTER — Other Ambulatory Visit (INDEPENDENT_AMBULATORY_CARE_PROVIDER_SITE_OTHER): Payer: Managed Care, Other (non HMO)

## 2018-11-22 ENCOUNTER — Other Ambulatory Visit: Payer: Self-pay

## 2018-11-22 DIAGNOSIS — R5383 Other fatigue: Secondary | ICD-10-CM | POA: Diagnosis not present

## 2018-11-23 LAB — COMPREHENSIVE METABOLIC PANEL
ALT: 17 IU/L (ref 0–32)
AST: 16 IU/L (ref 0–40)
Albumin/Globulin Ratio: 2.6 — ABNORMAL HIGH (ref 1.2–2.2)
Albumin: 4.7 g/dL (ref 3.8–4.9)
Alkaline Phosphatase: 107 IU/L (ref 39–117)
BUN / CREAT RATIO: 14 (ref 9–23)
BUN: 12 mg/dL (ref 6–24)
Bilirubin Total: 0.4 mg/dL (ref 0.0–1.2)
CO2: 24 mmol/L (ref 20–29)
Calcium: 9.7 mg/dL (ref 8.7–10.2)
Chloride: 103 mmol/L (ref 96–106)
Creatinine, Ser: 0.87 mg/dL (ref 0.57–1.00)
GFR calc Af Amer: 89 mL/min/{1.73_m2} (ref >59)
GFR, EST NON AFRICAN AMERICAN: 77 mL/min/{1.73_m2} (ref >59)
Globulin, Total: 1.8 g/dL (ref 1.5–4.5)
Glucose: 165 mg/dL — ABNORMAL HIGH (ref 65–99)
Potassium: 4.6 mmol/L (ref 3.5–5.2)
Sodium: 144 mmol/L (ref 134–144)
Total Protein: 6.5 g/dL (ref 6.0–8.5)

## 2018-11-23 LAB — CBC WITH DIFFERENTIAL/PLATELET
BASOS ABS: 0 10*3/uL (ref 0.0–0.2)
Basos: 1 %
EOS (ABSOLUTE): 0.1 10*3/uL (ref 0.0–0.4)
Eos: 1 %
Hematocrit: 35.7 % (ref 34.0–46.6)
Hemoglobin: 12 g/dL (ref 11.1–15.9)
Immature Grans (Abs): 0 10*3/uL (ref 0.0–0.1)
Immature Granulocytes: 0 %
Lymphocytes Absolute: 2.8 10*3/uL (ref 0.7–3.1)
Lymphs: 33 %
MCH: 29.6 pg (ref 26.6–33.0)
MCHC: 33.6 g/dL (ref 31.5–35.7)
MCV: 88 fL (ref 79–97)
Monocytes Absolute: 0.3 10*3/uL (ref 0.1–0.9)
Monocytes: 4 %
Neutrophils Absolute: 5.4 10*3/uL (ref 1.4–7.0)
Neutrophils: 61 %
Platelets: 259 10*3/uL (ref 150–450)
RBC: 4.06 x10E6/uL (ref 3.77–5.28)
RDW: 12.3 % (ref 11.7–15.4)
WBC: 8.6 10*3/uL (ref 3.4–10.8)

## 2018-11-24 ENCOUNTER — Encounter: Payer: Self-pay | Admitting: Family

## 2018-11-26 ENCOUNTER — Encounter: Payer: Self-pay | Admitting: Family

## 2018-11-26 ENCOUNTER — Ambulatory Visit (INDEPENDENT_AMBULATORY_CARE_PROVIDER_SITE_OTHER): Payer: Managed Care, Other (non HMO) | Admitting: Family

## 2018-11-26 ENCOUNTER — Other Ambulatory Visit: Payer: Self-pay

## 2018-11-26 DIAGNOSIS — Z1239 Encounter for other screening for malignant neoplasm of breast: Secondary | ICD-10-CM

## 2018-11-26 DIAGNOSIS — E7800 Pure hypercholesterolemia, unspecified: Secondary | ICD-10-CM

## 2018-11-26 DIAGNOSIS — D729 Disorder of white blood cells, unspecified: Secondary | ICD-10-CM

## 2018-11-26 DIAGNOSIS — E119 Type 2 diabetes mellitus without complications: Secondary | ICD-10-CM | POA: Diagnosis not present

## 2018-11-26 DIAGNOSIS — K219 Gastro-esophageal reflux disease without esophagitis: Secondary | ICD-10-CM

## 2018-11-26 DIAGNOSIS — Z0000 Encounter for general adult medical examination without abnormal findings: Secondary | ICD-10-CM | POA: Insufficient documentation

## 2018-11-26 MED ORDER — GLUCOSE BLOOD VI STRP: ORAL_STRIP | 3 refills | Status: DC

## 2018-11-26 NOTE — Assessment & Plan Note (Signed)
WBcs have historically been on upper end of normal, now more mid range.  Patient currently asymptomatic.  Have messaged hematology, Dr Rogue Bussing in regards if  this finding is  significant.  Patient will reach out to me if she does not hear from them in regards to this.

## 2018-11-26 NOTE — Progress Notes (Signed)
This visit type was conducted due to national recommendations for restrictions regarding the COVID-19 pandemic (e.g. social distancing).  This format is felt to be most appropriate for this patient at this time.  All issues noted in this document were discussed and addressed.  No physical exam was performed (except for noted visual exam findings with Video Visits). Virtual Visit via Video Note  I connected with@ on 11/26/18 at 10:00 AM EDT by a video enabled telemedicine application and verified that I am speaking with the correct person using two identifiers.  Location patient: home Location provider:work or home office Persons participating in the virtual visit: patient, provider  I discussed the limitations of evaluation and management by telemedicine and the availability of in person appointments. The patient expressed understanding and agreed to proceed.   HPI:  Feels well today. Would like to discuss labs,   DM-she feels like dietary indiscretion has led to her A1c being elevated.  She would like to stay on metformin 500 mg.  A1c 7.   HLD- she has recently started Crestor from Dr End.   RA- Following with rheumatology; currently on Xeljanz, methotrexate  She had noted that her white blood cells typically around the higher normal end.  No here in the chart, this appeared true.  They reach more mid normal range at this time.  Patient denies unexplained weight loss, night sweats, fevers.   She is currently on Xeljanz, rheumatologist per patient did not seem concerned with white blood cell count.   Due for pap smear. Will establish here for that; GYN has retired.   Headache has resolved since last visit.  GERD-symptoms have resolved since starting Pepcid AC.  No trouble swallowing or pain with swallowing.  ROS: See pertinent positives and negatives per HPI.  Past Medical History:  Diagnosis Date  . Arthritis    Rheumatoid  . Diabetes mellitus without complication ([place])   .  Hyperlipidemia   . Tobacco abuse     Past Surgical History:  Procedure Laterality Date  . COLONOSCOPY WITH PROPOFOL N/A 12/19/2016   Procedure: COLONOSCOPY WITH PROPOFOL;  Surgeon: Jonathon Bellows, MD;  Location: [facility] ENDOSCOPY;  Service: Endoscopy;  Laterality: N/A;    Family History  Problem Relation Age of Onset  . Heart attack Father 25  . Diabetes Other   . Breast cancer Maternal Aunt        great aunt >50  . Breast cancer Paternal Aunt        great aunt  . Hyperlipidemia Mother   . Kidney disease Mother     SOCIAL MP:NTIRWE smoker   Current Outpatient Medications:  .  CALCIUM-MAGNESIUM-ZINC PO, Take 2 tablets by mouth daily., Disp: , Rfl:  .  cholecalciferol (VITAMIN D) 1000 UNITS tablet, Take 1,000 Units by mouth daily., Disp: , Rfl:  .  Cyanocobalamin (HM SUPER VITAMIN B12) 2500 MCG CHEW, Chew by mouth daily., Disp: , Rfl:  .  folic acid (FOLVITE) 1 MG tablet, Take 1 mg by mouth daily., Disp: , Rfl:  .  metFORMIN (GLUCOPHAGE) 500 MG tablet, TAKE 1 TABLET BY MOUTH  DAILY WITH SUPPER, Disp: 90 tablet, Rfl: 2 .  methotrexate 2.5 MG tablet, Take 2.5 mg by mouth once a week. Take 8 tablets once a week on Thursday., Disp: , Rfl:  .  rosuvastatin (CRESTOR) 10 MG tablet, Take 1 tablet (10 mg total) by mouth daily., Disp: 90 tablet, Rfl: 3 .  XELJANZ XR 11 MG TB24, , Disp: , Rfl:  .  glucose blood (ONE TOUCH ULTRA TEST) test strip, USE AS INSTRUCTED TO TEST BLOOD SUGAR ONCE DAILY, Disp: 100 each, Rfl: 3  EXAM:  VITALS per patient if applicable:  GENERAL: alert, oriented, appears well and in no acute distress  HEENT: atraumatic, conjunttiva clear, no obvious abnormalities on inspection of external nose and ears  NECK: normal movements of the head and neck  LUNGS: on inspection no signs of respiratory distress, breathing rate appears normal, no obvious gross SOB, gasping or wheezing  CV: no obvious cyanosis  MS: moves all visible extremities without noticeable  abnormality  PSYCH/NEURO: pleasant and cooperative, no obvious depression or anxiety, speech and thought processing grossly intact  ASSESSMENT AND PLAN:  Discussed the following assessment and plan:  Type 2 diabetes mellitus without complication, without long-term current use of insulin (HCC)  Screening for breast cancer - Plan: MM 3D SCREEN BREAST BILATERAL  Gastroesophageal reflux disease, esophagitis presence not specified  Pure hypercholesterolemia  Abnormal WBC count  Problem List Items Addressed This Visit      Digestive   Gastroesophageal reflux disease    Resolved on Pepcid AC.  Will continue.  Advised patient that she will need EGD in the future        Endocrine   Type 2 diabetes mellitus without complication, without long-term current use of insulin ([place]) - Primary    Will continue metformin, will get A1c when patient returns for CPE.  Patient will modify diet, exercise.  Will follow        Other   HLD (hyperlipidemia)    On Crestor, will continue.      Abnormal WBC count    WBcs have historically been on upper end of normal, now more mid range.  Patient currently asymptomatic.  Have messaged hematology, Dr Rogue Bussing in regards if  this finding is  significant.  Patient will reach out to me if she does not hear from them in regards to this.      Screening for breast cancer    Ordered, patient will schedule.      Relevant Orders   MM 3D SCREEN BREAST BILATERAL        I discussed the assessment and treatment plan with the patient. The patient was provided an opportunity to ask questions and all were answered. The patient agreed with the plan and demonstrated an understanding of the instructions.   The patient was advised to call back or seek an in-person evaluation if the symptoms worsen or if the condition fails to improve as anticipated.    Mable Paris, FNP

## 2018-11-26 NOTE — Patient Instructions (Addendum)
I have messaged Dr Rogue Bussing, hematology, please follow-up with me if you not hear back from me in regards to this  Please schedule physical , Pap smear when you can.  We will also look at A1c at that time.   We placed a referral for mammogram this year. I asked that you call one the below locations and schedule this when it is convenient for you.   As discussed, I would like you to ask for 3D mammogram over the traditional 2D mammogram as new evidence suggest 3D is superior.   Please note that NOT all insurance companies cover 3D and you may have to pay a higher copay. You may call your insurance company to further clarify your benefits.   Options for [place]  [place], [place]  * [facility] [place], [place] * Note if you ask for 3D mammogram at this location, you must request [place], [place] location*

## 2018-11-26 NOTE — Assessment & Plan Note (Signed)
Will continue metformin, will get A1c when patient returns for CPE.  Patient will modify diet, exercise.  Will follow

## 2018-11-26 NOTE — Assessment & Plan Note (Signed)
Ordered, patient will schedule.

## 2018-11-26 NOTE — Assessment & Plan Note (Signed)
Resolved on Pepcid AC.  Will continue.  Advised patient that she will need EGD in the future

## 2018-11-26 NOTE — Assessment & Plan Note (Signed)
On Crestor, will continue.

## 2018-12-06 NOTE — Telephone Encounter (Signed)
Call pt This is a follow-up after office visit in regards to low baseline white blood cells   I received a nice note for Dr. Yevette Edwards oncology. He stated the below.  Thanks for reaching out to me. I have reviewed the labs that not concerning. If the white count continue to drop I would be happy to see her.

## 2018-12-07 ENCOUNTER — Other Ambulatory Visit: Payer: Self-pay

## 2018-12-07 ENCOUNTER — Encounter: Payer: Self-pay | Admitting: Family Medicine

## 2018-12-07 ENCOUNTER — Ambulatory Visit (INDEPENDENT_AMBULATORY_CARE_PROVIDER_SITE_OTHER): Payer: Managed Care, Other (non HMO) | Admitting: Family Medicine

## 2018-12-07 ENCOUNTER — Telehealth: Payer: Self-pay | Admitting: Family Medicine

## 2018-12-07 ENCOUNTER — Encounter: Payer: Self-pay | Admitting: Family

## 2018-12-07 DIAGNOSIS — N390 Urinary tract infection, site not specified: Secondary | ICD-10-CM | POA: Diagnosis not present

## 2018-12-07 MED ORDER — CEFDINIR 300 MG PO CAPS: 300.0000 mg | ORAL_CAPSULE | Freq: Two times a day (BID) | ORAL | 0 refills | Status: DC

## 2018-12-07 NOTE — Progress Notes (Addendum)
Patient ID: Regina Barnes, female   DOB: 21-May-1966, 53 y.o.   MRN: [id number]  Virtual Visit via Video Note  This visit type was conducted due to national recommendations for restrictions regarding the COVID-19 pandemic (e.g. social distancing).  This format is felt to be most appropriate for this patient at this time.  All issues noted in this document were discussed and addressed.  No physical exam was performed (except for noted visual exam findings with Video Visits).   I connected with Regina Barnes on 12/07/18 at 10:20 AM EDT by a video enabled telemedicine application or telephone and verified that I am speaking with the correct person using two identifiers. Location patient: home Location provider:  [facility] Persons participating in the virtual visit: patient, provider  I discussed the limitations, risks, security and privacy concerns of performing an evaluation and management service by video and the availability of in person appointments. I also discussed with the patient that there may be a patient responsible charge related to this service. The patient expressed understanding and agreed to proceed.  Reason for visit: UTI symptoms  HPI:  Patient and I connected via video today to discuss the UTI symptoms.  She has been having dysuria, frequency and urgency for the past week.  She did try taking over-the-counter Azo with minimal success in helping to reduce symptoms.  She also has been trying to increase water intake, but continues to have burning with urination and increased frequency.  Also states when she urinates, does not feel like much comes out but continually has to get up and go to the bathroom.    Review of Systems  Constitutional: Negative for chills, fatigue and fever.  HENT: Negative for congestion, ear pain, sinus pain and sore throat.   Eyes: Negative.   Respiratory: Negative for cough, shortness of breath and wheezing.   Cardiovascular: Negative for chest pain,  palpitations and leg swelling.  Gastrointestinal: Negative for abdominal pain, diarrhea, nausea and vomiting.  Genitourinary: +dysuria, frequency and urgency.  Musculoskeletal: Negative for arthralgias and myalgias.  Skin: Negative for color change, pallor and rash.  Neurological: Negative for syncope, light-headedness and headaches.  Psychiatric/Behavioral: The patient is not nervous/anxious.    Past Medical History:  Diagnosis Date  . Arthritis    Rheumatoid  . Diabetes mellitus without complication ([place])   . Hyperlipidemia   . Tobacco abuse     Past Surgical History:  Procedure Laterality Date  . COLONOSCOPY WITH PROPOFOL N/A 12/19/2016   Procedure: COLONOSCOPY WITH PROPOFOL;  Surgeon: Jonathon Bellows, MD;  Location: [facility] ENDOSCOPY;  Service: Endoscopy;  Laterality: N/A;    Family History  Problem Relation Age of Onset  . Heart attack Father 38  . Diabetes Other   . Breast cancer Maternal Aunt        great aunt >50  . Breast cancer Paternal Aunt        great aunt  . Hyperlipidemia Mother   . Kidney disease Mother     Current Outpatient Medications:  .  CALCIUM-MAGNESIUM-ZINC PO, Take 2 tablets by mouth daily., Disp: , Rfl:  .  cholecalciferol (VITAMIN D) 1000 UNITS tablet, Take 1,000 Units by mouth daily., Disp: , Rfl:  .  Cyanocobalamin (HM SUPER VITAMIN B12) 2500 MCG CHEW, Chew by mouth daily., Disp: , Rfl:  .  folic acid (FOLVITE) 1 MG tablet, Take 1 mg by mouth daily., Disp: , Rfl:  .  glucose blood (ONE TOUCH ULTRA TEST) test strip, USE AS INSTRUCTED  TO TEST BLOOD SUGAR ONCE DAILY, Disp: 100 each, Rfl: 3 .  metFORMIN (GLUCOPHAGE) 500 MG tablet, TAKE 1 TABLET BY MOUTH  DAILY WITH SUPPER, Disp: 90 tablet, Rfl: 2 .  methotrexate 2.5 MG tablet, Take 2.5 mg by mouth once a week. Take 8 tablets once a week on Thursday., Disp: , Rfl:  .  rosuvastatin (CRESTOR) 10 MG tablet, Take 1 tablet (10 mg total) by mouth daily., Disp: 90 tablet, Rfl: 3 .  XELJANZ XR 11 MG TB24, , Disp: ,  Rfl:   EXAM:   GENERAL: alert, oriented, appears well and in no acute distress  HEENT: atraumatic, conjunttiva clear, no obvious abnormalities on inspection of external nose and ears  NECK: normal movements of the head and neck  LUNGS: on inspection no signs of respiratory distress, breathing rate appears normal, no obvious gross SOB, gasping or wheezing  CV: no obvious cyanosis  MS: moves all visible extremities without noticeable abnormality  PSYCH/NEURO: pleasant and cooperative, no obvious depression or anxiety, speech and thought processing grossly intact  ASSESSMENT AND PLAN:  Discussed the following assessment and plan:  Urinary tract infection without hematuria, site unspecified - Plan: cefdinir (OMNICEF) 300 MG capsule  Patient symptoms do seem consistent with UTI.  She will take Omnicef twice daily for 5 days.  I originally wanted to prescribe bactrim, but this interacts with her methotrexate so Regina Barnes was chosen. Advised to increase water intake, avoid excess sugary caffeinated beverages and monitor for any worsening of symptoms.  Advised that if her symptoms continue to persist even after this treatment course, we will have to have her come in to bring a urine sample for lab testing.    I discussed the assessment and treatment plan with the patient. The patient was provided an opportunity to ask questions and all were answered. The patient agreed with the plan and demonstrated an understanding of the instructions.   The patient was advised to call back or seek an in-person evaluation if the symptoms worsen or if the condition fails to improve as anticipated.   Jodelle Green, FNP

## 2018-12-07 NOTE — Telephone Encounter (Signed)
Mychart message

## 2018-12-10 ENCOUNTER — Other Ambulatory Visit (INDEPENDENT_AMBULATORY_CARE_PROVIDER_SITE_OTHER): Payer: Managed Care, Other (non HMO)

## 2018-12-10 ENCOUNTER — Other Ambulatory Visit: Payer: Self-pay

## 2018-12-10 ENCOUNTER — Encounter: Payer: Self-pay | Admitting: Family

## 2018-12-10 DIAGNOSIS — N390 Urinary tract infection, site not specified: Secondary | ICD-10-CM | POA: Diagnosis not present

## 2018-12-10 MED ORDER — NITROFURANTOIN MONOHYD MACRO 100 MG PO CAPS: 100.0000 mg | ORAL_CAPSULE | Freq: Two times a day (BID) | ORAL | 0 refills | Status: AC

## 2018-12-12 LAB — URINE CULTURE: Organism ID, Bacteria: NO GROWTH

## 2018-12-13 ENCOUNTER — Telehealth: Payer: Self-pay | Admitting: Lab

## 2018-12-13 NOTE — Telephone Encounter (Signed)
Called Pt to find out how she was feeling, Pt stated Yes she does feel improvement, and she was better.

## 2019-02-02 ENCOUNTER — Other Ambulatory Visit: Payer: Self-pay | Admitting: Family

## 2019-02-02 DIAGNOSIS — E119 Type 2 diabetes mellitus without complications: Secondary | ICD-10-CM

## 2019-02-10 ENCOUNTER — Telehealth: Payer: Self-pay | Admitting: *Deleted

## 2019-02-10 ENCOUNTER — Ambulatory Visit
Admission: RE | Admit: 2019-02-10 | Discharge: 2019-02-10 | Disposition: A | Discharge status: Home / Self Care | Payer: Managed Care, Other (non HMO) | Source: Ambulatory Visit | Attending: Family | Admitting: Family

## 2019-02-10 ENCOUNTER — Other Ambulatory Visit: Payer: Self-pay

## 2019-02-10 ENCOUNTER — Telehealth: Payer: Self-pay | Admitting: Family

## 2019-02-10 DIAGNOSIS — Z1231 Encounter for screening mammogram for malignant neoplasm of breast: Secondary | ICD-10-CM | POA: Diagnosis present

## 2019-02-10 DIAGNOSIS — Z1239 Encounter for other screening for malignant neoplasm of breast: Secondary | ICD-10-CM

## 2019-02-10 NOTE — Telephone Encounter (Signed)
Copied from [place] [id number]. Topic: Quick Communication - See Telephone Encounter >> Feb 10, 2019  2:43 PM Blase Mess A wrote: CRM for notification. See Telephone encounter for: 02/10/19.  Caryl Pina calling from [facility] is calling request Cholesterol Labs Please advise [phone] Need 4-5 cholesterol Readings.

## 2019-02-10 NOTE — Telephone Encounter (Signed)
Copied from [place] [phone]. Topic: Quick Communication - See Telephone Encounter >> Feb 10, 2019  2:43 PM Blase Mess A wrote: CRM for notification. See Telephone encounter for: 02/10/19.  Caryl Pina calling from [facility] is calling request Cholesterol Labs Please advise [phone] Need 4-5 cholesterol Readings.

## 2019-02-11 NOTE — Telephone Encounter (Signed)
Faxed to [facility].

## 2019-02-11 NOTE — Telephone Encounter (Signed)
I have faxed last 4 Lipid Panel results to [facility].

## 2019-02-17 ENCOUNTER — Other Ambulatory Visit: Payer: Self-pay

## 2019-03-23 ENCOUNTER — Other Ambulatory Visit: Payer: Self-pay

## 2019-03-23 ENCOUNTER — Encounter: Payer: Self-pay | Admitting: Family

## 2019-03-23 ENCOUNTER — Encounter: Payer: Managed Care, Other (non HMO) | Admitting: Family

## 2019-03-23 ENCOUNTER — Ambulatory Visit (INDEPENDENT_AMBULATORY_CARE_PROVIDER_SITE_OTHER): Payer: Managed Care, Other (non HMO) | Admitting: Family

## 2019-03-23 VITALS — BP 122/80 | HR 85 | Temp 98.4°F | Ht 67.0 in | Wt 189.4 lb

## 2019-03-23 DIAGNOSIS — Z0000 Encounter for general adult medical examination without abnormal findings: Secondary | ICD-10-CM | POA: Diagnosis not present

## 2019-03-23 DIAGNOSIS — Z7189 Other specified counseling: Secondary | ICD-10-CM | POA: Diagnosis not present

## 2019-03-23 DIAGNOSIS — Z23 Encounter for immunization: Secondary | ICD-10-CM

## 2019-03-23 NOTE — Assessment & Plan Note (Signed)
CBE and pap performed. She will confirm colonoscopy repeat ( for now changed to 5 years). Tdap given.

## 2019-03-23 NOTE — Patient Instructions (Addendum)
Confirm when next colonoscopy  Is due ( ? 5 years) and let me know.   Please continue to follow with Dr End for further risk stratification based on your father's history.   Please let me know if you need anything or a new referral from our office to see him.   Stay safe!   Health Maintenance, Female Adopting a healthy lifestyle and getting preventive care are important in promoting health and wellness. Ask your health care provider about:  The right schedule for you to have regular tests and exams.  Things you can do on your own to prevent diseases and keep yourself healthy. What should I know about diet, weight, and exercise? Eat a healthy diet   Eat a diet that includes plenty of vegetables, fruits, low-fat dairy products, and lean protein.  Do not eat a lot of foods that are high in solid fats, added sugars, or sodium. Maintain a healthy weight Body mass index (BMI) is used to identify weight problems. It estimates body fat based on height and weight. Your health care provider can help determine your BMI and help you achieve or maintain a healthy weight. Get regular exercise Get regular exercise. This is one of the most important things you can do for your health. Most adults should:  Exercise for at least 150 minutes each week. The exercise should increase your heart rate and make you sweat (moderate-intensity exercise).  Do strengthening exercises at least twice a week. This is in addition to the moderate-intensity exercise.  Spend less time sitting. Even light physical activity can be beneficial. Watch cholesterol and blood lipids Have your blood tested for lipids and cholesterol at 52 years of age, then have this test every 5 years. Have your cholesterol levels checked more often if:  Your lipid or cholesterol levels are high.  You are older than 52 years of age.  You are at high risk for heart disease. What should I know about cancer screening? Depending on your  health history and family history, you may need to have cancer screening at various ages. This may include screening for:  Breast cancer.  Cervical cancer.  Colorectal cancer.  Skin cancer.  Lung cancer. What should I know about heart disease, diabetes, and high blood pressure? Blood pressure and heart disease  High blood pressure causes heart disease and increases the risk of stroke. This is more likely to develop in people who have high blood pressure readings, are of African descent, or are overweight.  Have your blood pressure checked: ? Every 3-5 years if you are 32-86 years of age. ? Every year if you are 70 years old or older. Diabetes Have regular diabetes screenings. This checks your fasting blood sugar level. Have the screening done:  Once every three years after age 102 if you are at a normal weight and have a low risk for diabetes.  More often and at a younger age if you are overweight or have a high risk for diabetes. What should I know about preventing infection? Hepatitis B If you have a higher risk for hepatitis B, you should be screened for this virus. Talk with your health care provider to find out if you are at risk for hepatitis B infection. Hepatitis C Testing is recommended for:  Everyone born from [birth year] through 1965.  Anyone with known risk factors for hepatitis C. Sexually transmitted infections (STIs)  Get screened for STIs, including gonorrhea and chlamydia, if: ? You are sexually active and are  younger than 52 years of age. ? You are older than 52 years of age and your health care provider tells you that you are at risk for this type of infection. ? Your sexual activity has changed since you were last screened, and you are at increased risk for chlamydia or gonorrhea. Ask your health care provider if you are at risk.  Ask your health care provider about whether you are at high risk for HIV. Your health care provider may recommend a prescription  medicine to help prevent HIV infection. If you choose to take medicine to prevent HIV, you should first get tested for HIV. You should then be tested every 3 months for as long as you are taking the medicine. Pregnancy  If you are about to stop having your period (premenopausal) and you may become pregnant, seek counseling before you get pregnant.  Take 400 to 800 micrograms (mcg) of folic acid every day if you become pregnant.  Ask for birth control (contraception) if you want to prevent pregnancy. Osteoporosis and menopause Osteoporosis is a disease in which the bones lose minerals and strength with aging. This can result in bone fractures. If you are 57 years old or older, or if you are at risk for osteoporosis and fractures, ask your health care provider if you should:  Be screened for bone loss.  Take a calcium or vitamin D supplement to lower your risk of fractures.  Be given hormone replacement therapy (HRT) to treat symptoms of menopause. Follow these instructions at home: Lifestyle  Do not use any products that contain nicotine or tobacco, such as cigarettes, e-cigarettes, and chewing tobacco. If you need help quitting, ask your health care provider.  Do not use street drugs.  Do not share needles.  Ask your health care provider for help if you need support or information about quitting drugs. Alcohol use  Do not drink alcohol if: ? Your health care provider tells you not to drink. ? You are pregnant, may be pregnant, or are planning to become pregnant.  If you drink alcohol: ? Limit how much you use to 0-1 drink a day. ? Limit intake if you are breastfeeding.  Be aware of how much alcohol is in your drink. In the [place] one drink equals one 12 oz bottle of beer (355 mL), one 5 oz glass of wine (148 mL), or one 1 oz glass of hard liquor (44 mL). General instructions  Schedule regular health, dental, and eye exams.  Stay current with your vaccines.  Tell your health  care provider if: ? You often feel depressed. ? You have ever been abused or do not feel safe at home. Summary  Adopting a healthy lifestyle and getting preventive care are important in promoting health and wellness.  Follow your health care provider's instructions about healthy diet, exercising, and getting tested or screened for diseases.  Follow your health care provider's instructions on monitoring your cholesterol and blood pressure. This information is not intended to replace advice given to you by your health care provider. Make sure you discuss any questions you have with your health care provider. Document Released: 02/24/2011 Document Revised: 08/04/2018 Document Reviewed: 08/04/2018 Elsevier Patient Education  2020 [employer].

## 2019-03-23 NOTE — Assessment & Plan Note (Addendum)
Advised to follow up with Dr End for further risk stratification. She stated that she would consider this and call to make follow up if she felt necessary.

## 2019-03-23 NOTE — Progress Notes (Signed)
Subjective:    Patient ID: Regina Barnes, female    DOB: [birth year], 53 y.o.   MRN: [id number]  CC: Regina Barnes is a 53 y.o. female who presents today for physical exam.    HPI: Feels well . No complaints  Saw Dr End 09/2018 due to family h/o MI ( father) and was started on crestor. No stress test or cardiac scoring at that time.    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  Breast Cancer Screening: Mammogram UTD Cervical Cancer Screening: due Bone Health screening/DEXA for 65+: No increased fracture risk. Defer screening at this time. Lung Cancer Screening: Doesn't have 30 year pack year history and age > 76 years.       Tetanus - due   HIV Screening- Candidate for, consents Labs: Screening labs today. Exercise: Gets regular exercise.  Alcohol use: occasional  Smoking/tobacco use: former smoker.  Wears seat belt: Yes.  HISTORY:  Past Medical History:  Diagnosis Date  . Arthritis    Rheumatoid  . Diabetes mellitus without complication ([place])   . Hyperlipidemia   . Tobacco abuse     Past Surgical History:  Procedure Laterality Date  . COLONOSCOPY WITH PROPOFOL N/A 12/19/2016   Procedure: COLONOSCOPY WITH PROPOFOL;  Surgeon: Jonathon Bellows, MD;  Location: [facility] ENDOSCOPY;  Service: Endoscopy;  Laterality: N/A;   Family History  Problem Relation Age of Onset  . Heart attack Father 34  . Diabetes Other   . Breast cancer Maternal Aunt        great aunt >50  . Breast cancer Paternal Aunt        great aunt  . Hyperlipidemia Mother   . Kidney disease Mother       ALLERGIES: Codeine  Current Outpatient Medications on File Prior to Visit  Medication Sig Dispense Refill  . CALCIUM-MAGNESIUM-ZINC PO Take 2 tablets by mouth daily.    . cholecalciferol (VITAMIN D) 1000 UNITS tablet Take 1,000 Units  by mouth daily.    . Cyanocobalamin (HM SUPER VITAMIN B12) 2500 MCG CHEW Chew by mouth daily.    . folic acid (FOLVITE) 1 MG tablet Take 1 mg by mouth daily.    Marland Kitchen glucose blood (ONE TOUCH ULTRA TEST) test strip USE AS INSTRUCTED TO TEST BLOOD SUGAR ONCE DAILY 100 each 3  . metFORMIN (GLUCOPHAGE) 500 MG tablet TAKE 1 TABLET BY MOUTH DAILY WITH SUPPER 90 tablet 2  . methotrexate 2.5 MG tablet Take 2.5 mg by mouth once a week. Take 8 tablets once a week on Thursday.    Regina Barnes XR 11 MG TB24     . rosuvastatin (CRESTOR) 10 MG tablet Take 1 tablet (10 mg total) by mouth daily. 90 tablet 3   No current facility-administered medications on file prior to visit.     Social History   Tobacco Use  . Smoking status: Former Smoker    Packs/day: 0.25    Years: 20.00    Pack years: 5.00    Types: Cigarettes    Quit date: 05/28/2018    Years since quitting: 0.8  . Smokeless tobacco: Never Used  Substance Use Topics  . Alcohol use: Yes    Alcohol/week: 4.0 standard drinks    Types: 4 Cans of beer per week  . Drug use: No    Review of Systems  Constitutional: Negative for chills, fever and unexpected weight change.  HENT: Negative for congestion.   Respiratory: Negative for cough.   Cardiovascular: Negative for chest pain, palpitations and leg swelling.  Gastrointestinal: Negative for nausea and vomiting.  Musculoskeletal: Negative for arthralgias and myalgias.  Skin: Negative for rash.  Neurological: Negative for headaches.  Hematological: Negative for adenopathy.  Psychiatric/Behavioral: Negative for confusion.      Objective:    BP 122/80   Pulse 85   Temp 98.4 F (36.9 C)   Ht 5\' 7"  (1.702 m)   Wt 189 lb 6.4 oz (85.9 kg)   SpO2 97%   BMI 29.66 kg/m   BP Readings from Last 3 Encounters:  03/23/19 122/80  10/06/18 (!) 146/90  06/14/18 130/90   Wt Readings from Last 3 Encounters:  03/23/19 189 lb 6.4 oz (85.9 kg)  10/06/18 188 lb (85.3 kg)  06/14/18 184 lb (83.5 kg)     Physical Exam Vitals signs reviewed.  Constitutional:      Appearance: She is well-developed.  Eyes:     Conjunctiva/sclera: Conjunctivae normal.  Neck:     Thyroid: No thyroid mass or thyromegaly.  Cardiovascular:     Rate and Rhythm: Normal rate and regular rhythm.     Pulses: Normal pulses.     Heart sounds: Normal heart sounds.  Pulmonary:     Effort: Pulmonary effort is normal.     Breath sounds: Normal breath sounds. No wheezing, rhonchi or rales.  Chest:     Breasts: Breasts are symmetrical.        Right: No inverted nipple, mass, nipple discharge, skin change or tenderness.        Left: No inverted nipple, mass, nipple discharge, skin change or tenderness.  Genitourinary:    Cervix: No cervical motion tenderness, discharge or friability.     Uterus: Not enlarged, not fixed and not tender.      Adnexa:        Right: No mass, tenderness or fullness.         Left: No mass, tenderness or fullness.       Comments: Pap performed. No CMT. Unable to appreciated ovaries. Lymphadenopathy:     Head:     Right side of head: No submental, submandibular, tonsillar, preauricular, posterior auricular or occipital adenopathy.     Left side of head: No submental, submandibular, tonsillar, preauricular, posterior auricular or occipital adenopathy.     Cervical:     Right cervical: No superficial, deep or posterior cervical adenopathy.    Left cervical: No superficial, deep or posterior cervical adenopathy.     Upper Body:     Right upper body: No pectoral adenopathy.     Left upper body: No pectoral adenopathy.  Skin:    General: Skin is warm and dry.  Neurological:     Mental Status: She is alert.  Psychiatric:        Speech: Speech normal.        Behavior: Behavior normal.        Thought Content: Thought content normal.        Assessment & Plan:   Problem List Items Addressed This Visit      Other   Cardiac risk counseling    Advised to follow up with Dr End for  further risk stratification. She stated that she would consider this and call to make follow up if she felt necessary.       Routine physical examination - Primary  CBE and pap performed. She will confirm colonoscopy repeat ( for now changed to 5 years). Tdap given.        Relevant Orders   TSH   CBC with Differential/Platelet   Comprehensive metabolic panel   Hemoglobin A1c   HIV Antibody (routine testing w rflx)   VITAMIN D 25 Hydroxy (Vit-D Deficiency, Fractures)   Lipid panel   Pap liquid-based and HPV (high risk)    Other Visit Diagnoses    Need for Tdap vaccination       Relevant Orders   Tdap vaccine greater than or equal to 7yo IM (Completed)       I am having Nakeitha Milligan. Reas maintain her folic acid, cholecalciferol, CALCIUM-MAGNESIUM-ZINC PO, Cyanocobalamin, Xeljanz XR, rosuvastatin, methotrexate, glucose blood, and metFORMIN.   No orders of the defined types were placed in this encounter.   Return precautions given.   Risks, benefits, and alternatives of the medications and treatment plan prescribed today were discussed, and patient expressed understanding.   Education regarding symptom management and diagnosis given to patient on AVS.   Continue to follow with Burnard Hawthorne, FNP for routine health maintenance.   Agustina Caroli and I agreed with plan.   Mable Paris, FNP

## 2019-03-23 NOTE — Assessment & Plan Note (Signed)
Pending a1c. 

## 2019-03-24 LAB — LIPID PANEL
Chol/HDL Ratio: 1.9 ratio (ref 0.0–4.4)
Cholesterol, Total: 144 mg/dL (ref 100–199)
HDL: 75 mg/dL (ref >39)
LDL Calculated: 52 mg/dL (ref 0–99)
Triglycerides: 83 mg/dL (ref 0–149)
VLDL Cholesterol Cal: 17 mg/dL (ref 5–40)

## 2019-03-24 LAB — COMPREHENSIVE METABOLIC PANEL
ALT: 22 IU/L (ref 0–32)
AST: 22 IU/L (ref 0–40)
Albumin/Globulin Ratio: 2.4 — ABNORMAL HIGH (ref 1.2–2.2)
Albumin: 4.7 g/dL (ref 3.8–4.9)
Alkaline Phosphatase: 96 IU/L (ref 39–117)
BUN/Creatinine Ratio: 10 (ref 9–23)
BUN: 8 mg/dL (ref 6–24)
Bilirubin Total: 0.2 mg/dL (ref 0.0–1.2)
CO2: 25 mmol/L (ref 20–29)
Calcium: 9.3 mg/dL (ref 8.7–10.2)
Chloride: 104 mmol/L (ref 96–106)
Creatinine, Ser: 0.78 mg/dL (ref 0.57–1.00)
GFR calc Af Amer: 101 mL/min/{1.73_m2} (ref >59)
GFR calc non Af Amer: 88 mL/min/{1.73_m2} (ref >59)
Globulin, Total: 2 g/dL (ref 1.5–4.5)
Glucose: 106 mg/dL — ABNORMAL HIGH (ref 65–99)
Potassium: 4.1 mmol/L (ref 3.5–5.2)
Sodium: 143 mmol/L (ref 134–144)
Total Protein: 6.7 g/dL (ref 6.0–8.5)

## 2019-03-24 LAB — HEMOGLOBIN A1C
Est. average glucose Bld gHb Est-mCnc: 143 mg/dL
Hgb A1c MFr Bld: 6.6 % — ABNORMAL HIGH (ref 4.8–5.6)

## 2019-03-24 LAB — CBC WITH DIFFERENTIAL/PLATELET
Basophils Absolute: 0 10*3/uL (ref 0.0–0.2)
Basos: 0 %
EOS (ABSOLUTE): 0.1 10*3/uL (ref 0.0–0.4)
Eos: 1 %
Hematocrit: 33 % — ABNORMAL LOW (ref 34.0–46.6)
Hemoglobin: 11.2 g/dL (ref 11.1–15.9)
Immature Grans (Abs): 0 10*3/uL (ref 0.0–0.1)
Immature Granulocytes: 0 %
Lymphocytes Absolute: 2.5 10*3/uL (ref 0.7–3.1)
Lymphs: 34 %
MCH: 31.1 pg (ref 26.6–33.0)
MCHC: 33.9 g/dL (ref 31.5–35.7)
MCV: 92 fL (ref 79–97)
Monocytes Absolute: 0.4 10*3/uL (ref 0.1–0.9)
Monocytes: 6 %
Neutrophils Absolute: 4.3 10*3/uL (ref 1.4–7.0)
Neutrophils: 59 %
Platelets: 243 10*3/uL (ref 150–450)
RBC: 3.6 x10E6/uL — ABNORMAL LOW (ref 3.77–5.28)
RDW: 13.4 % (ref 11.7–15.4)
WBC: 7.4 10*3/uL (ref 3.4–10.8)

## 2019-03-24 LAB — VITAMIN D 25 HYDROXY (VIT D DEFICIENCY, FRACTURES): Vit D, 25-Hydroxy: 40.3 ng/mL (ref 30.0–100.0)

## 2019-03-24 LAB — HIV ANTIBODY (ROUTINE TESTING W REFLEX): HIV Screen 4th Generation wRfx: NONREACTIVE

## 2019-03-24 LAB — TSH: TSH: 2.39 u[IU]/mL (ref 0.450–4.500)

## 2019-03-25 ENCOUNTER — Other Ambulatory Visit: Payer: Self-pay | Admitting: Family

## 2019-03-25 DIAGNOSIS — R899 Unspecified abnormal finding in specimens from other organs, systems and tissues: Secondary | ICD-10-CM

## 2019-03-31 LAB — PAP LB AND HPV HIGH-RISK: HPV, high-risk: NEGATIVE

## 2019-05-09 ENCOUNTER — Other Ambulatory Visit (INDEPENDENT_AMBULATORY_CARE_PROVIDER_SITE_OTHER): Payer: Managed Care, Other (non HMO)

## 2019-05-09 ENCOUNTER — Other Ambulatory Visit: Payer: Self-pay

## 2019-05-09 DIAGNOSIS — R899 Unspecified abnormal finding in specimens from other organs, systems and tissues: Secondary | ICD-10-CM | POA: Diagnosis not present

## 2019-05-10 LAB — CBC WITH DIFFERENTIAL/PLATELET
Basophils Absolute: 0 10*3/uL (ref 0.0–0.2)
Basos: 0 %
EOS (ABSOLUTE): 0.1 10*3/uL (ref 0.0–0.4)
Eos: 2 %
Hematocrit: 33.9 % — ABNORMAL LOW (ref 34.0–46.6)
Hemoglobin: 11 g/dL — ABNORMAL LOW (ref 11.1–15.9)
Immature Grans (Abs): 0 10*3/uL (ref 0.0–0.1)
Immature Granulocytes: 0 %
Lymphocytes Absolute: 2.3 10*3/uL (ref 0.7–3.1)
Lymphs: 35 %
MCH: 30.7 pg (ref 26.6–33.0)
MCHC: 32.4 g/dL (ref 31.5–35.7)
MCV: 95 fL (ref 79–97)
Monocytes Absolute: 0.2 10*3/uL (ref 0.1–0.9)
Monocytes: 3 %
Neutrophils Absolute: 4 10*3/uL (ref 1.4–7.0)
Neutrophils: 60 %
Platelets: 230 10*3/uL (ref 150–450)
RBC: 3.58 x10E6/uL — ABNORMAL LOW (ref 3.77–5.28)
RDW: 13.3 % (ref 11.7–15.4)
WBC: 6.7 10*3/uL (ref 3.4–10.8)

## 2019-05-12 ENCOUNTER — Other Ambulatory Visit: Payer: Self-pay | Admitting: Internal Medicine

## 2019-05-12 DIAGNOSIS — D649 Anemia, unspecified: Secondary | ICD-10-CM

## 2019-05-12 NOTE — Progress Notes (Signed)
Orders placed for f/u labs.  

## 2019-09-30 ENCOUNTER — Ambulatory Visit: Payer: Managed Care, Other (non HMO) | Attending: Family

## 2019-09-30 DIAGNOSIS — Z20822 Contact with and (suspected) exposure to covid-19: Secondary | ICD-10-CM

## 2019-10-01 LAB — NOVEL CORONAVIRUS, NAA: SARS-CoV-2, NAA: DETECTED — AB

## 2019-10-02 ENCOUNTER — Other Ambulatory Visit (HOSPITAL_COMMUNITY): Payer: Self-pay | Admitting: Nurse Practitioner

## 2019-10-02 ENCOUNTER — Encounter (HOSPITAL_COMMUNITY): Payer: Self-pay | Admitting: Nurse Practitioner

## 2019-10-02 ENCOUNTER — Telehealth: Payer: Self-pay | Admitting: Nurse Practitioner

## 2019-10-02 DIAGNOSIS — U071 COVID-19: Secondary | ICD-10-CM

## 2019-10-02 NOTE — Progress Notes (Signed)
I connected by phone with Regina Barnes on 10/02/2019 at 9:20 AM to discuss the potential use of an new treatment for mild to moderate COVID-19 viral infection in non-hospitalized patients.  This patient is a 54 y.o. female that meets the FDA criteria for Emergency Use Authorization of bamlanivimab or casirivimab\imdevimab.  Has a (+) direct SARS-CoV-2 viral test result  Has mild or moderate COVID-19   Is ? 54 years of age and weighs ? 40 kg  Is NOT hospitalized due to COVID-19  Is NOT requiring oxygen therapy or requiring an increase in baseline oxygen flow rate due to COVID-19  Is within 10 days of symptom onset  Has at least one of the high risk factor(s) for progression to severe COVID-19 and/or hospitalization as defined in EUA.  Specific high risk criteria : Currently receiving immunosuppressive treatment, diabetes  I have spoken and communicated the following to the patient or parent/caregiver:  1. FDA has authorized the emergency use of bamlanivimab and casirivimab\imdevimab for the treatment of mild to moderate COVID-19 in adults and pediatric patients with positive results of direct SARS-CoV-2 viral testing who are 36 years of age and older weighing at least 40 kg, and who are at high risk for progressing to severe COVID-19 and/or hospitalization.  2. The significant known and potential risks and benefits of bamlanivimab and casirivimab\imdevimab, and the extent to which such potential risks and benefits are unknown.  3. Information on available alternative treatments and the risks and benefits of those alternatives, including clinical trials.  4. Patients treated with bamlanivimab and casirivimab\imdevimab should continue to self-isolate and use infection control measures (e.g., wear mask, isolate, social distance, avoid sharing personal items, clean and disinfect "high touch" surfaces, and frequent handwashing) according to CDC guidelines.   5. The patient or parent/caregiver  has the option to accept or refuse bamlanivimab or casirivimab\imdevimab .  After reviewing this information with the patient, The patient agreed to proceed with receiving the bamlanimivab infusion and will be provided a copy of the Fact sheet prior to receiving the infusion.Beckey Rutter, [place], AGNP-C [phone] ([place])

## 2019-10-02 NOTE — Telephone Encounter (Signed)
See orders encounter

## 2019-10-03 ENCOUNTER — Ambulatory Visit (HOSPITAL_COMMUNITY)
Admission: RE | Admit: 2019-10-03 | Discharge: 2019-10-03 | Disposition: A | Discharge status: Home / Self Care | Payer: Managed Care, Other (non HMO) | Source: Ambulatory Visit | Attending: Pulmonary Disease | Admitting: Pulmonary Disease

## 2019-10-03 DIAGNOSIS — Z23 Encounter for immunization: Secondary | ICD-10-CM | POA: Insufficient documentation

## 2019-10-03 DIAGNOSIS — E118 Type 2 diabetes mellitus with unspecified complications: Secondary | ICD-10-CM | POA: Insufficient documentation

## 2019-10-03 DIAGNOSIS — U071 COVID-19: Secondary | ICD-10-CM | POA: Diagnosis not present

## 2019-10-03 MED ORDER — FAMOTIDINE IN NACL 20-0.9 MG/50ML-% IV SOLN: 20.0000 mg | Freq: Once | INTRAVENOUS | Status: DC | PRN

## 2019-10-03 MED ORDER — DIPHENHYDRAMINE HCL 50 MG/ML IJ SOLN: 50.0000 mg | Freq: Once | INTRAMUSCULAR | Status: DC | PRN

## 2019-10-03 MED ORDER — METHYLPREDNISOLONE SODIUM SUCC 125 MG IJ SOLR: 125.0000 mg | Freq: Once | INTRAMUSCULAR | Status: DC | PRN

## 2019-10-03 MED ORDER — EPINEPHRINE 0.3 MG/0.3ML IJ SOAJ: 0.3000 mg | Freq: Once | INTRAMUSCULAR | Status: DC | PRN

## 2019-10-03 MED ORDER — SODIUM CHLORIDE 0.9 % IV SOLN
INTRAVENOUS | Status: DC | PRN
  Administered 2019-10-03: 13:00:00 250 mL via INTRAVENOUS

## 2019-10-03 MED ORDER — ALBUTEROL SULFATE HFA 108 (90 BASE) MCG/ACT IN AERS: 2.0000 | INHALATION_SPRAY | Freq: Once | RESPIRATORY_TRACT | Status: DC | PRN

## 2019-10-03 MED ORDER — SODIUM CHLORIDE 0.9 % IV SOLN
700.0000 mg | Freq: Once | INTRAVENOUS | Status: AC
  Administered 2019-10-03: 700 mg via INTRAVENOUS
  Filled 2019-10-03: qty 20

## 2019-10-03 NOTE — Discharge Instructions (Signed)

## 2019-10-03 NOTE — Progress Notes (Signed)
  Diagnosis: COVID-19  Physician:Dr. Joya Gaskins  Procedure: [facility] Med: bamlanivimab infusion - Provided patient with bamlanimivab fact sheet for patients, parents and caregivers prior to infusion.  Complications: No immediate complications noted.  Discharge: Discharged home   Regina Barnes 10/03/2019

## 2019-10-05 ENCOUNTER — Ambulatory Visit: Payer: Managed Care, Other (non HMO) | Admitting: Family

## 2019-11-22 ENCOUNTER — Telehealth: Payer: Self-pay | Admitting: Family

## 2019-11-22 NOTE — Telephone Encounter (Signed)
Call patient Have not seen patient since last summer.  I received labs from [facility].  Please advise patient to make a follow-up appointment so we can discuss the labs.  Some of the values were cut off, please have her bring lab report with her when she comes in

## 2019-11-22 NOTE — Telephone Encounter (Signed)
I called patient & scheduled for for f/u 12/23/19. She said that she would try to get a copy of labs & bring to her appointment.

## 2019-12-01 ENCOUNTER — Other Ambulatory Visit: Payer: Self-pay

## 2019-12-01 ENCOUNTER — Ambulatory Visit (INDEPENDENT_AMBULATORY_CARE_PROVIDER_SITE_OTHER): Payer: Managed Care, Other (non HMO) | Admitting: Internal Medicine

## 2019-12-01 ENCOUNTER — Encounter: Payer: Self-pay | Admitting: Internal Medicine

## 2019-12-01 VITALS — BP 128/90 | HR 92 | Ht 67.0 in | Wt 188.4 lb

## 2019-12-01 DIAGNOSIS — Z8249 Family history of ischemic heart disease and other diseases of the circulatory system: Secondary | ICD-10-CM

## 2019-12-01 DIAGNOSIS — E785 Hyperlipidemia, unspecified: Secondary | ICD-10-CM

## 2019-12-01 NOTE — Patient Instructions (Signed)
Medication Instructions:  Your physician recommends that you continue on your current medications as directed. Please refer to the Current Medication list given to you today.  Hold your rosuvastatin for 3-4 weeks to see if your leg achiness and weakness improve or not.  Please send [place] a MyChart message to let us know the outcome either way.  *If you need a refill on your cardiac medications before your next appointment, please call your pharmacy*   Follow-Up: At [facility], you and your health needs are our priority.  As part of our continuing mission to provide you with exceptional heart care, we have created designated Provider Care Teams.  These Care Teams include your primary Cardiologist (physician) and Advanced Practice Providers (APPs -  Physician Assistants and Nurse Practitioners) who all work together to provide you with the care you need, when you need it.  We recommend signing up for the patient portal called "MyChart".  Sign up information is provided on this After Visit Summary.  MyChart is used to connect with patients for Virtual Visits (Telemedicine).  Patients are able to view lab/test results, encounter notes, upcoming appointments, etc.  Non-urgent messages can be sent to your provider as well.   To learn more about what you can do with MyChart, go to [email or web address].    Your next appointment:   6 month(s)  The format for your next appointment:   In Person  Provider:    You may see DR Harrell Gave END or one of the following Advanced Practice Providers on your designated Care Team:    Murray Hodgkins, NP  Christell Faith, PA-C  Marrianne Mood, PA-C

## 2019-12-01 NOTE — Progress Notes (Signed)
Follow-up Outpatient Visit Date: 12/01/2019  Primary Care Provider: Burnard Hawthorne, FNP [address] [place] [address]  Chief Complaint: Leg pain  HPI:  Ms. Standen is a 54 y.o. female with history of diabetes mellitus, hyperlipidemia, rheumatoid arthritis, COVID-19 infection, tobacco use, and family history of premature CAD, who presents for follow-up of coronary artery disease risk.  I met Ms. Orozco in 09/2018.  She was asymptomatic at that point.  We agreed to add rosuvastatin 10 mg daily and defer additional testing.  LDL was very well controlled on follow-up lipid panel in July (53).  Today, Ms. Whistler that she has been doing relatively well.  She has noticed some myalgias in her legs since starting rosuvastatin.  She wonders if it could also be due to being less active.  She is recovering from COVID-19 infection in February.  She had cough and mild fever as well as a little bit of shortness of breath after the infection.  She feels like she is back to baseline.  She denies chest pain, palpitations, lightheadedness, edema, orthopnea, and PND.  --------------------------------------------------------------------------------------------------  Cardiovascular History & Procedures: Cardiovascular Problems:  Multiple cardiovascular risk factors  Risk Factors:  Hyperlipidemia, diabetes mellitus, tobacco use, and family history  Cath/PCI:  None  CV Surgery:  None  EP Procedures and Devices:  None  Non-Invasive Evaluation(s):  None  Recent CV Pertinent Labs: Lab Results  Component Value Date   CHOL 144 03/23/2019   HDL 75 03/23/2019   LDLCALC 52 03/23/2019   TRIG 83 03/23/2019   CHOLHDL 1.9 03/23/2019   K 4.1 03/23/2019   BUN 8 03/23/2019   CREATININE 0.78 03/23/2019    Past medical and surgical history were reviewed and updated in EPIC.  Current Meds  Medication Sig  . CALCIUM-MAGNESIUM-ZINC PO Take 2 tablets by mouth daily.  .  cholecalciferol (VITAMIN D) 1000 UNITS tablet Take 1,000 Units by mouth daily.  . Cyanocobalamin (HM SUPER VITAMIN B12) 2500 MCG CHEW Chew by mouth daily.  . folic acid (FOLVITE) 1 MG tablet Take 1 mg by mouth daily.  Marland Kitchen glucose blood (ONE TOUCH ULTRA TEST) test strip USE AS INSTRUCTED TO TEST BLOOD SUGAR ONCE DAILY  . metFORMIN (GLUCOPHAGE) 500 MG tablet TAKE 1 TABLET BY MOUTH DAILY WITH SUPPER  . methotrexate 2.5 MG tablet Take 2.5 mg by mouth once a week. Take 8 tablets once a week on Thursday.  . rosuvastatin (CRESTOR) 10 MG tablet Take 1 tablet (10 mg total) by mouth daily.  Morrie Sheldon XR 11 MG TB24     Allergies: Codeine  Social History   Tobacco Use  . Smoking status: Former Smoker    Packs/day: 0.25    Years: 20.00    Pack years: 5.00    Types: Cigarettes    Quit date: 05/28/2018    Years since quitting: 1.5  . Smokeless tobacco: Never Used  Substance Use Topics  . Alcohol use: Yes    Alcohol/week: 4.0 standard drinks    Types: 4 Cans of beer per week  . Drug use: No    Family History  Problem Relation Age of Onset  . Heart attack Father 6  . Diabetes Other   . Breast cancer Maternal Aunt        great aunt >50  . Breast cancer Paternal Aunt        great aunt  . Hyperlipidemia Mother   . Kidney disease Mother     Review of Systems: A  12-system review of systems was performed and was negative except as noted in the HPI.  --------------------------------------------------------------------------------------------------  Physical Exam: BP 128/90 (BP Location: Left Arm, Patient Position: Sitting, Cuff Size: Normal)   Pulse 92   Ht 5' 7" (1.702 m)   Wt 188 lb 6 oz (85.4 kg)   SpO2 96%   BMI 29.50 kg/m   Repeat BP 126/88.  General: NAD. HEENT: No conjunctival pallor or scleral icterus. Facemask in place. Neck: No JVD or HJR. Lungs: Normal work of breathing. Clear to auscultation bilaterally without wheezes or crackles. Heart: Regular rate and rhythm without  murmurs, rubs, or gallops. Non-displaced PMI. Abd: Bowel sounds present. Soft, NT/ND without hepatosplenomegaly Ext: No lower extremity edema. Radial, PT, and DP pulses are 2+ bilaterally.  EKG: Normal sinus rhythm with possible left atrial enlargement.  No significant change since 10/06/2018.  Lab Results  Component Value Date   WBC 6.7 05/09/2019   HGB 11.0 (L) 05/09/2019   HCT 33.9 (L) 05/09/2019   MCV 95 05/09/2019   PLT 230 05/09/2019    Lab Results  Component Value Date   NA 143 03/23/2019   K 4.1 03/23/2019   CL 104 03/23/2019   CO2 25 03/23/2019   BUN 8 03/23/2019   CREATININE 0.78 03/23/2019   GLUCOSE 106 (H) 03/23/2019   ALT 22 03/23/2019    Lab Results  Component Value Date   CHOL 144 03/23/2019   HDL 75 03/23/2019   LDLCALC 52 03/23/2019   TRIG 83 03/23/2019   CHOLHDL 1.9 03/23/2019    --------------------------------------------------------------------------------------------------  ASSESSMENT AND PLAN: Hyperlipidemia: Lipids are well controlled with low-dose rosuvastatin, though Ms. Rogers Blocker has experienced some aches and weakness in her legs.  We have agreed to a statin holiday to see if her symptoms improve.  I asked Ms. Bubolz to reach out to [place] in about a month to report on her symptoms.  Family history of ASCVD: Ms. Moxley remains asymptomatic.  I think that aggressive primary prevention with lifestyle modifications and lipid control are key.  As above, we will undergo a statin holiday and need to consider transitioning to a different agent should symptoms improve off rosuvastatin.  If she is intolerant of another statin, we may need to consider further risk stratification such as coronary calcium scoring to determine if treatment with a PCSK9 inhibitor would be indicated.  Follow-up: Return to clinic in 6 months.  Nelva Bush, MD 12/01/2019 3:35 PM

## 2019-12-02 ENCOUNTER — Encounter: Payer: Self-pay | Admitting: Internal Medicine

## 2019-12-02 DIAGNOSIS — Z8249 Family history of ischemic heart disease and other diseases of the circulatory system: Secondary | ICD-10-CM | POA: Insufficient documentation

## 2019-12-23 ENCOUNTER — Ambulatory Visit: Payer: Managed Care, Other (non HMO) | Admitting: Family

## 2019-12-23 ENCOUNTER — Encounter: Payer: Self-pay | Admitting: Family

## 2019-12-23 ENCOUNTER — Other Ambulatory Visit: Payer: Self-pay

## 2019-12-23 VITALS — BP 108/74 | HR 86 | Temp 97.6°F | Ht 67.0 in | Wt 190.8 lb

## 2019-12-23 DIAGNOSIS — E119 Type 2 diabetes mellitus without complications: Secondary | ICD-10-CM

## 2019-12-23 DIAGNOSIS — D649 Anemia, unspecified: Secondary | ICD-10-CM | POA: Diagnosis not present

## 2019-12-23 DIAGNOSIS — E7800 Pure hypercholesterolemia, unspecified: Secondary | ICD-10-CM | POA: Diagnosis not present

## 2019-12-23 NOTE — Assessment & Plan Note (Signed)
Currently holding statin due to increased joint pain.

## 2019-12-23 NOTE — Progress Notes (Signed)
Subjective:    Patient ID: Regina Barnes, female    DOB: [birth year], 54 y.o.   MRN: [id number]  CC: Regina Barnes is a 54 y.o. female who presents today for follow up.   HPI: Doing well today, no complaints.    History of anemia.  No blood in in stool.  No longer menstruating.  No fatigue, shortness of breath  DM-compliant with Metformin   Inflammatory arthritis-follows with [facility] rheumatology HLD- holding crestor after appointment with Dr Regina Barnes 12/01/19.   HISTORY:  Past Medical History:  Diagnosis Date  . Arthritis    Rheumatoid  . Diabetes mellitus without complication ([place])   . Hyperlipidemia   . Tobacco abuse    Past Surgical History:  Procedure Laterality Date  . COLONOSCOPY WITH PROPOFOL N/A 12/19/2016   Procedure: COLONOSCOPY WITH PROPOFOL;  Surgeon: Regina Bellows, MD;  Location: [facility] ENDOSCOPY;  Service: Endoscopy;  Laterality: N/A;   Family History  Problem Relation Age of Onset  . Heart attack Father 77  . Diabetes Other   . Breast cancer Maternal Aunt        great aunt >50  . Breast cancer Paternal Aunt        great aunt  . Hyperlipidemia Mother   . Kidney disease Mother     Allergies: Codeine Current Outpatient Medications on File Prior to Visit  Medication Sig Dispense Refill  . CALCIUM-MAGNESIUM-ZINC PO Take 2 tablets by mouth daily.    . cholecalciferol (VITAMIN D) 1000 UNITS tablet Take 1,000 Units by mouth daily.    . folic acid (FOLVITE) 1 MG tablet Take 1 mg by mouth daily.    Marland Kitchen glucose blood (ONE TOUCH ULTRA TEST) test strip USE AS INSTRUCTED TO TEST BLOOD SUGAR ONCE DAILY 100 each 3  . metFORMIN (GLUCOPHAGE) 500 MG tablet TAKE 1 TABLET BY MOUTH DAILY WITH SUPPER 90 tablet 2  . methotrexate 2.5 MG tablet Take 2.5 mg by mouth once a week. Take 8 tablets once a week on Thursday.    Regina Barnes XR 11 MG TB24     . rosuvastatin (CRESTOR) 10 MG tablet Take 1 tablet (10 mg total) by mouth daily. (Patient not taking: Reported on 12/23/2019) 90 tablet 3     No current facility-administered medications on file prior to visit.    Social History   Tobacco Use  . Smoking status: Former Smoker    Packs/day: 0.25    Years: 20.00    Pack years: 5.00    Types: Cigarettes    Quit date: 05/28/2018    Years since quitting: 1.5  . Smokeless tobacco: Never Used  Substance Use Topics  . Alcohol use: Yes    Alcohol/week: 4.0 standard drinks    Types: 4 Cans of beer per week  . Drug use: No    Review of Systems  Constitutional: Negative for chills, fatigue and fever.  Respiratory: Negative for cough.   Cardiovascular: Negative for chest pain and palpitations.  Gastrointestinal: Negative for nausea and vomiting.      Objective:    BP 108/74   Pulse 86   Temp 97.6 F (36.4 C) (Temporal)   Ht 5\' 7"  (1.702 m)   Wt 190 lb 12.8 oz (86.5 kg)   SpO2 97%   BMI 29.88 kg/m  BP Readings from Last 3 Encounters:  12/23/19 108/74  12/01/19 128/90  10/03/19 (!) 144/94   Wt Readings from Last 3 Encounters:  12/23/19 190 lb 12.8 oz (86.5 kg)  12/01/19  188 lb 6 oz (85.4 kg)  03/23/19 189 lb 6.4 oz (85.9 kg)    Physical Exam Vitals reviewed.  Constitutional:      Appearance: She is well-developed.  Eyes:     Conjunctiva/sclera: Conjunctivae normal.  Cardiovascular:     Rate and Rhythm: Normal rate and regular rhythm.     Pulses: Normal pulses.     Heart sounds: Normal heart sounds.  Pulmonary:     Effort: Pulmonary effort is normal.     Breath sounds: Normal breath sounds. No wheezing, rhonchi or rales.  Skin:    General: Skin is warm and dry.  Neurological:     Mental Status: She is alert.  Psychiatric:        Speech: Speech normal.        Behavior: Behavior normal.        Thought Content: Thought content normal.        Assessment & Plan:   Problem List Items Addressed This Visit      Endocrine   Diabetes mellitus, new onset ([place]) - Primary (Chronic)   Relevant Orders   Lipid panel   Hemoglobin A1c   Microalbumin /  creatinine urine ratio   Type 2 diabetes mellitus without complication, without long-term current use of insulin (HCC)    Suspect controlled.  Pending A1c.  Long discussion in  regards to renal protection with ACE or ARB.  Patient  declines at this time and certainly consider in the future if blood pressure were to elevate      Relevant Orders   Microalbumin / creatinine urine ratio     Other   Anemia    Pending lab evaluation.       Relevant Orders   B12 and Folate Panel   Celiac Disease Ab Screen w/Rfx   CBC with Differential/Platelet   Iron, TIBC and Ferritin Panel   HLD (hyperlipidemia)    Currently holding statin due to increased joint pain.          I have discontinued Regina Barnes. Regina Barnes's Cyanocobalamin. I am also having her maintain her folic acid, cholecalciferol, CALCIUM-MAGNESIUM-ZINC PO, Xeljanz XR, rosuvastatin, methotrexate, glucose blood, and metFORMIN.   No orders of the defined types were placed in this encounter.   Return precautions given.   Risks, benefits, and alternatives of the medications and treatment plan prescribed today were discussed, and patient expressed understanding.   Education regarding symptom management and diagnosis given to patient on AVS.  Continue to follow with Burnard Hawthorne, FNP for routine health maintenance.   Agustina Caroli and I agreed with plan.   Mable Paris, FNP

## 2019-12-23 NOTE — Assessment & Plan Note (Signed)
Pending lab evaluation.

## 2019-12-23 NOTE — Assessment & Plan Note (Signed)
Suspect controlled.  Pending A1c.  Long discussion in  regards to renal protection with ACE or ARB.  Patient  declines at this time and certainly consider in the future if blood pressure were to elevate

## 2019-12-23 NOTE — Patient Instructions (Signed)
Nice to see you!   

## 2019-12-24 LAB — MICROALBUMIN / CREATININE URINE RATIO
Creatinine, Urine: 68.8 mg/dL
Microalb/Creat Ratio: <4 mg/g creat (ref 0–29)
Microalbumin, Urine: <3 ug/mL

## 2019-12-24 LAB — IRON,TIBC AND FERRITIN PANEL
Ferritin: 68 ng/mL (ref 15–150)
Iron Saturation: 14 % — ABNORMAL LOW (ref 15–55)
Iron: 45 ug/dL (ref 27–159)
Total Iron Binding Capacity: 314 ug/dL (ref 250–450)
UIBC: 269 ug/dL (ref 131–425)

## 2019-12-28 LAB — LIPID PANEL
Chol/HDL Ratio: 3 ratio (ref 0.0–4.4)
Cholesterol, Total: 226 mg/dL — ABNORMAL HIGH (ref 100–199)
HDL: 75 mg/dL (ref >39)
LDL Chol Calc (NIH): 137 mg/dL — ABNORMAL HIGH (ref 0–99)
Triglycerides: 83 mg/dL (ref 0–149)
VLDL Cholesterol Cal: 14 mg/dL (ref 5–40)

## 2019-12-28 LAB — CBC WITH DIFFERENTIAL/PLATELET
Basophils Absolute: 0 10*3/uL (ref 0.0–0.2)
Basos: 0 %
EOS (ABSOLUTE): 0.1 10*3/uL (ref 0.0–0.4)
Eos: 1 %
Hematocrit: 37.2 % (ref 34.0–46.6)
Hemoglobin: 12.5 g/dL (ref 11.1–15.9)
Immature Grans (Abs): 0 10*3/uL (ref 0.0–0.1)
Immature Granulocytes: 0 %
Lymphocytes Absolute: 2.1 10*3/uL (ref 0.7–3.1)
Lymphs: 24 %
MCH: 31.4 pg (ref 26.6–33.0)
MCHC: 33.6 g/dL (ref 31.5–35.7)
MCV: 94 fL (ref 79–97)
Monocytes Absolute: 0.5 10*3/uL (ref 0.1–0.9)
Monocytes: 6 %
Neutrophils Absolute: 6.1 10*3/uL (ref 1.4–7.0)
Neutrophils: 69 %
Platelets: 211 10*3/uL (ref 150–450)
RBC: 3.98 x10E6/uL (ref 3.77–5.28)
RDW: 13.3 % (ref 11.7–15.4)
WBC: 8.8 10*3/uL (ref 3.4–10.8)

## 2019-12-28 LAB — CELIAC DISEASE AB SCREEN W/RFX
Antigliadin Abs, IgA: 20 units — ABNORMAL HIGH (ref 0–19)
IgA/Immunoglobulin A, Serum: 158 mg/dL (ref 87–352)
Transglutaminase IgA: <2 U/mL (ref 0–3)

## 2019-12-28 LAB — HEMOGLOBIN A1C
Est. average glucose Bld gHb Est-mCnc: 183 mg/dL
Hgb A1c MFr Bld: 8 % — ABNORMAL HIGH (ref 4.8–5.6)

## 2019-12-28 LAB — B12 AND FOLATE PANEL
Folate: >20 ng/mL (ref >3.0)
Vitamin B-12: 413 pg/mL (ref 232–1245)

## 2020-01-03 ENCOUNTER — Telehealth: Payer: Self-pay

## 2020-01-03 NOTE — Telephone Encounter (Signed)
LMTCB to go over labs with patient & scheduled f/u with Joycelyn Schmid to discuss.

## 2020-01-13 ENCOUNTER — Other Ambulatory Visit: Payer: Self-pay

## 2020-01-13 DIAGNOSIS — E119 Type 2 diabetes mellitus without complications: Secondary | ICD-10-CM

## 2020-01-13 MED ORDER — METFORMIN HCL 500 MG PO TABS: ORAL_TABLET | ORAL | 2 refills | Status: DC

## 2020-02-06 ENCOUNTER — Encounter: Payer: Self-pay | Admitting: Family

## 2020-02-06 ENCOUNTER — Ambulatory Visit: Payer: Managed Care, Other (non HMO) | Admitting: Family

## 2020-02-06 ENCOUNTER — Other Ambulatory Visit: Payer: Self-pay

## 2020-02-06 VITALS — BP 122/78 | HR 78 | Temp 97.5°F | Resp 16 | Wt 189.0 lb

## 2020-02-06 DIAGNOSIS — E7800 Pure hypercholesterolemia, unspecified: Secondary | ICD-10-CM | POA: Diagnosis not present

## 2020-02-06 DIAGNOSIS — E119 Type 2 diabetes mellitus without complications: Secondary | ICD-10-CM

## 2020-02-06 MED ORDER — METFORMIN HCL 500 MG PO TABS: 500.0000 mg | ORAL_TABLET | Freq: Two times a day (BID) | ORAL | 2 refills | Status: DC

## 2020-02-06 NOTE — Patient Instructions (Signed)
Increase metformin to 500mg  twice per daily.   Nice to see you!

## 2020-02-06 NOTE — Progress Notes (Signed)
Subjective:    Patient ID: Regina Barnes, female    DOB: 01-Sep-1965, 54 y.o.   MRN: [id number]  CC: Regina Barnes is a 54 y.o. female who presents today for follow up.   HPI: Feels well today, no complaints  Dm- FGB 180.  a1c 8 .  Compliant with Metformin 500 mg once per day diagnosed 5 years ago.    Celiac disease- has stopped eating gluten. declines consult with GI for EGD further evaluation for slight positive gluten screen  HLD- compliant with crestor     HISTORY:  Past Medical History:  Diagnosis Date  . Arthritis    Rheumatoid  . Diabetes mellitus without complication ([facility])   . Hyperlipidemia   . Tobacco abuse    Past Surgical History:  Procedure Laterality Date  . COLONOSCOPY WITH PROPOFOL N/A 12/19/2016   Procedure: COLONOSCOPY WITH PROPOFOL;  Surgeon: Jonathon Bellows, MD;  Location: [facility] ENDOSCOPY;  Service: Endoscopy;  Laterality: N/A;   Family History  Problem Relation Age of Onset  . Heart attack Father 45  . Diabetes Other   . Breast cancer Maternal Aunt        great aunt >50  . Breast cancer Paternal Aunt        great aunt  . Hyperlipidemia Mother   . Kidney disease Mother     Allergies: Codeine Current Outpatient Medications on File Prior to Visit  Medication Sig Dispense Refill  . CALCIUM-MAGNESIUM-ZINC PO Take 2 tablets by mouth daily.    . cholecalciferol (VITAMIN D) 1000 UNITS tablet Take 1,000 Units by mouth daily.    . folic acid (FOLVITE) 1 MG tablet Take 1 mg by mouth daily.    Marland Kitchen glucose blood (ONE TOUCH ULTRA TEST) test strip USE AS INSTRUCTED TO TEST BLOOD SUGAR ONCE DAILY 100 each 3  . methotrexate 2.5 MG tablet Take 2.5 mg by mouth once a week. Take 8 tablets once a week on Thursday.    . rosuvastatin (CRESTOR) 10 MG tablet Take 1 tablet (10 mg total) by mouth daily. (Patient not taking: Reported on 12/23/2019) 90 tablet 3  . XELJANZ XR 11 MG TB24      No current facility-administered medications on file prior to visit.    Social  History   Tobacco Use  . Smoking status: Former Smoker    Packs/day: 0.25    Years: 20.00    Pack years: 5.00    Types: Cigarettes    Quit date: 05/28/2018    Years since quitting: 1.6  . Smokeless tobacco: Never Used  Substance Use Topics  . Alcohol use: Yes    Alcohol/week: 4.0 standard drinks    Types: 4 Cans of beer per week  . Drug use: No    Review of Systems  Constitutional: Negative for chills and fever.  Respiratory: Negative for cough.   Cardiovascular: Negative for chest pain and palpitations.  Gastrointestinal: Negative for nausea and vomiting.      Objective:    BP 122/78   Pulse 78   Temp (!) 97.5 F (36.4 C)   Resp 16   Wt 189 lb (85.7 kg)   SpO2 98%   BMI 29.60 kg/m  BP Readings from Last 3 Encounters:  02/06/20 122/78  12/23/19 108/74  12/01/19 128/90   Wt Readings from Last 3 Encounters:  02/06/20 189 lb (85.7 kg)  12/23/19 190 lb 12.8 oz (86.5 kg)  12/01/19 188 lb 6 oz (85.4 kg)    Physical Exam Vitals  reviewed.  Constitutional:      Appearance: She is well-developed.  Eyes:     Conjunctiva/sclera: Conjunctivae normal.  Cardiovascular:     Rate and Rhythm: Normal rate and regular rhythm.     Pulses: Normal pulses.     Heart sounds: Normal heart sounds.  Pulmonary:     Effort: Pulmonary effort is normal.     Breath sounds: Normal breath sounds. No wheezing, rhonchi or rales.  Skin:    General: Skin is warm and dry.  Neurological:     Mental Status: She is alert.  Psychiatric:        Speech: Speech normal.        Behavior: Behavior normal.        Thought Content: Thought content normal.        Assessment & Plan:   Problem List Items Addressed This Visit      Endocrine   Diabetes mellitus, new onset ([place]) - Primary (Chronic)   Relevant Medications   metFORMIN (GLUCOPHAGE) 500 MG tablet   Other Relevant Orders   Comprehensive metabolic panel (Completed)   Alkaline phosphatase, isoenzymes (Completed)   Type 2 diabetes  mellitus without complication, without long-term current use of insulin (HCC)    Uncontrolled. Increase metformin. Declines victoza at this time. Will discuss ACE-I/ARB at follow up.       Relevant Medications   metFORMIN (GLUCOPHAGE) 500 MG tablet     Other   HLD (hyperlipidemia)    Compliant with crestor, continue.          I have changed Regina Barnes. Lundblad's metFORMIN. I am also having her maintain her folic acid, cholecalciferol, CALCIUM-MAGNESIUM-ZINC PO, Xeljanz XR, rosuvastatin, methotrexate, and glucose blood.   Meds ordered this encounter  Medications  . metFORMIN (GLUCOPHAGE) 500 MG tablet    Sig: Take 1 tablet (500 mg total) by mouth 2 (two) times daily with a meal.    Dispense:  90 tablet    Refill:  2    Order Specific Question:   Supervising Provider    Answer:   Crecencio Mc [[id number]]    Return precautions given.   Risks, benefits, and alternatives of the medications and treatment plan prescribed today were discussed, and patient expressed understanding.   Education regarding symptom management and diagnosis given to patient on AVS.  Continue to follow with Burnard Hawthorne, FNP for routine health maintenance.   Agustina Caroli and I agreed with plan.   Mable Paris, FNP

## 2020-02-07 NOTE — Assessment & Plan Note (Signed)
Compliant with crestor, continue 

## 2020-02-07 NOTE — Assessment & Plan Note (Addendum)
Uncontrolled. Increase metformin. Declines victoza at this time. Will discuss ACE-I/ARB at follow up.

## 2020-02-09 LAB — ALKALINE PHOSPHATASE, ISOENZYMES
BONE FRACTION: 51 % (ref 14–68)
INTESTINAL FRAC.: 4 % (ref 0–18)
LIVER FRACTION: 45 % (ref 18–85)

## 2020-02-09 LAB — COMPREHENSIVE METABOLIC PANEL
ALT: 15 IU/L (ref 0–32)
AST: 20 IU/L (ref 0–40)
Albumin/Globulin Ratio: 2 (ref 1.2–2.2)
Albumin: 4.4 g/dL (ref 3.8–4.9)
Alkaline Phosphatase: 123 IU/L — ABNORMAL HIGH (ref 48–121)
BUN/Creatinine Ratio: 15 (ref 9–23)
BUN: 13 mg/dL (ref 6–24)
Bilirubin Total: 0.2 mg/dL (ref 0.0–1.2)
CO2: 24 mmol/L (ref 20–29)
Calcium: 9.3 mg/dL (ref 8.7–10.2)
Chloride: 103 mmol/L (ref 96–106)
Creatinine, Ser: 0.85 mg/dL (ref 0.57–1.00)
GFR calc Af Amer: 90 mL/min/{1.73_m2} (ref >59)
GFR calc non Af Amer: 78 mL/min/{1.73_m2} (ref >59)
Globulin, Total: 2.2 g/dL (ref 1.5–4.5)
Glucose: 129 mg/dL — ABNORMAL HIGH (ref 65–99)
Potassium: 4.2 mmol/L (ref 3.5–5.2)
Sodium: 140 mmol/L (ref 134–144)
Total Protein: 6.6 g/dL (ref 6.0–8.5)

## 2020-02-13 ENCOUNTER — Other Ambulatory Visit: Payer: Self-pay

## 2020-02-14 MED ORDER — ROSUVASTATIN CALCIUM 10 MG PO TABS: 10.0000 mg | ORAL_TABLET | Freq: Every day | ORAL | 1 refills | Status: DC

## 2020-03-02 ENCOUNTER — Telehealth: Payer: Self-pay | Admitting: Family

## 2020-03-02 NOTE — Telephone Encounter (Signed)
Provider out office on 8/13 unable to leave message for pt to reschedule

## 2020-03-15 ENCOUNTER — Other Ambulatory Visit: Payer: Self-pay | Admitting: Family

## 2020-03-15 DIAGNOSIS — Z1231 Encounter for screening mammogram for malignant neoplasm of breast: Secondary | ICD-10-CM

## 2020-04-06 ENCOUNTER — Ambulatory Visit: Payer: Managed Care, Other (non HMO) | Admitting: Family

## 2020-04-11 ENCOUNTER — Other Ambulatory Visit: Payer: Self-pay | Admitting: Family

## 2020-04-11 DIAGNOSIS — E119 Type 2 diabetes mellitus without complications: Secondary | ICD-10-CM

## 2020-04-25 ENCOUNTER — Ambulatory Visit
Admission: RE | Admit: 2020-04-25 | Discharge: 2020-04-25 | Disposition: A | Discharge status: Home / Self Care | Payer: Managed Care, Other (non HMO) | Source: Ambulatory Visit | Attending: Family | Admitting: Family

## 2020-04-25 ENCOUNTER — Other Ambulatory Visit: Payer: Self-pay

## 2020-04-25 DIAGNOSIS — Z1231 Encounter for screening mammogram for malignant neoplasm of breast: Secondary | ICD-10-CM | POA: Diagnosis not present

## 2020-05-28 ENCOUNTER — Other Ambulatory Visit: Payer: Self-pay

## 2020-05-28 ENCOUNTER — Ambulatory Visit (INDEPENDENT_AMBULATORY_CARE_PROVIDER_SITE_OTHER): Payer: Managed Care, Other (non HMO) | Admitting: Internal Medicine

## 2020-05-28 ENCOUNTER — Encounter: Payer: Self-pay | Admitting: Internal Medicine

## 2020-05-28 VITALS — BP 124/80 | HR 90 | Ht 67.0 in | Wt 187.4 lb

## 2020-05-28 DIAGNOSIS — E785 Hyperlipidemia, unspecified: Secondary | ICD-10-CM

## 2020-05-28 DIAGNOSIS — Z7189 Other specified counseling: Secondary | ICD-10-CM | POA: Diagnosis not present

## 2020-05-28 NOTE — Progress Notes (Signed)
Follow-up Outpatient Visit Date: 05/28/2020  Primary Care Provider: Burnard Hawthorne, FNP [address] [place] [address]  Chief Complaint: Follow-up hyperlipidemia  HPI:  Regina Barnes is a 54 y.o. female with history of type 2 diabetes mellitus, hyperlipidemia, rheumatoid arthritis, COVID-19 infection, tobacco use, and family history of premature coronary artery disease, who presents for follow-up of hyperlipidemia and coronary artery disease risk.  I last saw Regina Barnes in April, at which time she was doing relatively well though she noted some myalgias since starting rosuvastatin.  We agreed to a statin holiday to see if her myalgias would improve.  Regina Barnes restarted rosuvastatin after a month and reports that she has been tolerating it well without side effects.  Lipid panel through her employer in August was notable for a total cholesterol of 164, HDL 83, LDL 67, and triglycerides of 71.  Regina Barnes feels well, denying chest pain, shortness of breath, palpitations, and edema.  She has occasional transient orthostatic lightheadedness without syncope or fall.  Unfortunately, she has started smoking about 1/3 pack/day again due to stress at work.  She inquires about a trial of Chantix, as this has helped her in the past.  --------------------------------------------------------------------------------------------------  Cardiovascular History & Procedures: Cardiovascular Problems:  Multiple cardiovascular risk factors  Risk Factors:  Hyperlipidemia, diabetes mellitus, tobacco use, and family history  Cath/PCI:  None  CV Surgery:  None  EP Procedures and Devices:  None  Non-Invasive Evaluation(s):  None  Recent CV Pertinent Labs: Lab Results  Component Value Date   CHOL 226 (H) 12/23/2019   HDL 75 12/23/2019   LDLCALC 137 (H) 12/23/2019   TRIG 83 12/23/2019   CHOLHDL 3.0 12/23/2019   K 4.2 02/06/2020   BUN 13 02/06/2020   CREATININE 0.85  02/06/2020    Past medical and surgical history were reviewed and updated in EPIC.  Current Meds  Medication Sig  . CALCIUM-MAGNESIUM-ZINC PO Take 2 tablets by mouth daily.  . cholecalciferol (VITAMIN D) 1000 UNITS tablet Take 1,000 Units by mouth daily.  . folic acid (FOLVITE) 1 MG tablet Take 1 mg by mouth daily.  Marland Kitchen glucose blood (ONE TOUCH ULTRA TEST) test strip USE AS INSTRUCTED TO TEST BLOOD SUGAR ONCE DAILY  . metFORMIN (GLUCOPHAGE) 500 MG tablet TAKE 1 TABLET BY MOUTH  TWICE DAILY WITH A MEAL  . methotrexate 2.5 MG tablet Take 2.5 mg by mouth once a week. Take 8 tablets once a week on Thursday.  . rosuvastatin (CRESTOR) 10 MG tablet Take 1 tablet (10 mg total) by mouth daily.  Morrie Sheldon XR 11 MG TB24 daily.     Allergies: Codeine  Social History   Tobacco Use  . Smoking status: Former Smoker    Packs/day: 0.25    Years: 20.00    Pack years: 5.00    Types: Cigarettes    Quit date: 05/28/2018    Years since quitting: 2.0  . Smokeless tobacco: Never Used  Substance Use Topics  . Alcohol use: Yes    Alcohol/week: 4.0 standard drinks    Types: 4 Cans of beer per week  . Drug use: No    Family History  Problem Relation Age of Onset  . Heart attack Father 63  . Diabetes Other   . Breast cancer Maternal Aunt        great aunt >50  . Breast cancer Paternal Aunt        great aunt  . Hyperlipidemia Mother   .  Kidney disease Mother     Review of Systems: A 12-system review of systems was performed and was negative except as noted in the HPI.  --------------------------------------------------------------------------------------------------  Physical Exam: BP 124/80 (BP Location: Left Arm, Patient Position: Sitting, Cuff Size: Normal)   Pulse 90   Ht 5\' 7"  (1.702 m)   Wt 187 lb 6 oz (85 kg)   SpO2 98%   BMI 29.35 kg/m   General: NAD. Neck: No JVD or HJR. Lungs: Clear to auscultation without wheezes or crackles. Heart: Regular rate and rhythm without murmurs,  rubs, or gallops. Abdomen: Soft, nontender, nondistended. Extremities: No lower extremity edema.  EKG: Normal sinus rhythm with possible left atrial enlargement.  No significant change from prior tracing on 12/01/2019.  Lab Results  Component Value Date   WBC 8.8 12/23/2019   HGB 12.5 12/23/2019   HCT 37.2 12/23/2019   MCV 94 12/23/2019   PLT 211 12/23/2019    Lab Results  Component Value Date   NA 140 02/06/2020   K 4.2 02/06/2020   CL 103 02/06/2020   CO2 24 02/06/2020   BUN 13 02/06/2020   CREATININE 0.85 02/06/2020   GLUCOSE 129 (H) 02/06/2020   ALT 15 02/06/2020    Lab Results  Component Value Date   CHOL 226 (H) 12/23/2019   HDL 75 12/23/2019   LDLCALC 137 (H) 12/23/2019   TRIG 83 12/23/2019   CHOLHDL 3.0 12/23/2019    --------------------------------------------------------------------------------------------------  ASSESSMENT AND PLAN: Hyperlipidemia: Cholesterol is well controlled on rosuvastatin 10 mg daily.  Regina Barnes is tolerating the medication well.  No further changes at this time.  Tobacco use: Unfortunately, Regina Barnes has started smoking again.  She has had success quitting with Chantix in the past, though the medication is currently not available.  We discussed alternative options including bupropion and nicotine replacement therapy.  Regina Barnes would like to quit without pharmacotherapy at this time.  Follow-up: Return to clinic in 1 year.  Nelva Bush, MD 05/28/2020 11:10 AM

## 2020-05-28 NOTE — Patient Instructions (Signed)
Medication Instructions:  Your physician recommends that you continue on your current medications as directed. Please refer to the Current Medication list given to you today.  *If you need a refill on your cardiac medications before your next appointment, please call your pharmacy*  Follow-Up: At [facility], you and your health needs are our priority.  As part of our continuing mission to provide you with exceptional heart care, we have created designated Provider Care Teams.  These Care Teams include your primary Cardiologist (physician) and Advanced Practice Providers (APPs -  Physician Assistants and Nurse Practitioners) who all work together to provide you with the care you need, when you need it.  We recommend signing up for the patient portal called "MyChart".  Sign up information is provided on this After Visit Summary.  MyChart is used to connect with patients for Virtual Visits (Telemedicine).  Patients are able to view lab/test results, encounter notes, upcoming appointments, etc.  Non-urgent messages can be sent to your provider as well.   To learn more about what you can do with MyChart, go to [email or web address].    Your next appointment:   12 month(s)  The format for your next appointment:   In Person  Provider:   You may see DR Harrell Gave END or one of the following Advanced Practice Providers on your designated Care Team:    Murray Hodgkins, NP  Christell Faith, PA-C  Marrianne Mood, PA-C  Cadence [place], [place]

## 2020-06-12 ENCOUNTER — Encounter: Payer: Self-pay | Admitting: Family

## 2020-06-13 ENCOUNTER — Telehealth: Payer: Self-pay

## 2020-06-13 NOTE — Telephone Encounter (Signed)
LMTCB to see if patient wanted to pick up Rosa form. I have faxed for her,  But no way of knowing if email went through.

## 2020-06-13 NOTE — Telephone Encounter (Signed)
I have emailed form for patient as well as faxed to her.

## 2020-07-10 ENCOUNTER — Encounter: Payer: Self-pay | Admitting: Family

## 2020-08-06 ENCOUNTER — Other Ambulatory Visit: Payer: Self-pay | Admitting: Internal Medicine

## 2020-08-15 ENCOUNTER — Encounter: Payer: Self-pay | Admitting: Family

## 2020-08-15 ENCOUNTER — Telehealth (INDEPENDENT_AMBULATORY_CARE_PROVIDER_SITE_OTHER): Payer: Managed Care, Other (non HMO) | Admitting: Family

## 2020-08-15 ENCOUNTER — Telehealth: Payer: Self-pay

## 2020-08-15 DIAGNOSIS — E7800 Pure hypercholesterolemia, unspecified: Secondary | ICD-10-CM

## 2020-08-15 DIAGNOSIS — R748 Abnormal levels of other serum enzymes: Secondary | ICD-10-CM

## 2020-08-15 DIAGNOSIS — R0981 Nasal congestion: Secondary | ICD-10-CM

## 2020-08-15 DIAGNOSIS — E119 Type 2 diabetes mellitus without complications: Secondary | ICD-10-CM | POA: Diagnosis not present

## 2020-08-15 MED ORDER — METFORMIN HCL 500 MG PO TABS: 1000.0000 mg | ORAL_TABLET | Freq: Two times a day (BID) | ORAL | 3 refills | Status: DC

## 2020-08-15 MED ORDER — AMOXICILLIN-POT CLAVULANATE 875-125 MG PO TABS: 1.0000 | ORAL_TABLET | Freq: Two times a day (BID) | ORAL | 0 refills | Status: AC

## 2020-08-15 NOTE — Assessment & Plan Note (Signed)
Patient will scheduled dexa. Pending cmp

## 2020-08-15 NOTE — Progress Notes (Signed)
Virtual Visit via Video Note  I connected with@  on 08/15/20 at  8:00 AM EST by a video enabled telemedicine application and verified that I am speaking with the correct person using two identifiers.  Location patient: home Location provider:work  Persons participating in the virtual visit: patient, provider  I discussed the limitations of evaluation and management by telemedicine and the availability of in person appointments. The patient expressed understanding and agreed to proceed  Interactive audio and video telecommunications were attempted between this provider and patient, however failed, due to patient having technical difficulties or patient did not have access to video capability.  We continued and completed visit with audio only.   HPI: Complains of nasal congestion 6 days ago, slight improvement.  Rare cough. Endorses sinus congestion, PND. Sneezing has resolved.  No fever, sob, wheezing, sore throat.   Has been doing the netti pot, sudafed, with some relief.   Continues to smoke.   Elevated alk phos. Due for DEXA.     DM- a1c 7.5. Compliant metformin 541m bid.   HLD- compliant with crestor 138m Due for lipid panel.   Recently followed up with Dr End 2 months ago.     ROS: See pertinent positives and negatives per HPI.    EXAM:  VITALS per patient if applicable: BP 12364/68 Ht 5' 7"  (1.702 m)   Wt 185 lb (83.9 kg)   BMI 28.98 kg/m  BP Readings from Last 3 Encounters:  08/15/20 125/75  05/28/20 124/80  02/06/20 122/78   Wt Readings from Last 3 Encounters:  08/15/20 185 lb (83.9 kg)  05/28/20 187 lb 6 oz (85 kg)  02/06/20 189 lb (85.7 kg)      ASSESSMENT AND PLAN:  Discussed the following assessment and plan:  Problem List Items Addressed This Visit      Respiratory   Sinus congestion    Afebrile. Duration 6 days. Improving. Advised continued conservative management however if she doesn't continue to improve by Christmas she may start  augmentin with probiotics.        Relevant Medications   amoxicillin-clavulanate (AUGMENTIN) 875-125 MG tablet     Endocrine   Diabetes mellitus, new onset (HCC) (Chronic)   Relevant Medications   metFORMIN (GLUCOPHAGE) 500 MG tablet   Other Relevant Orders   Lipid panel   Microalbumin / creatinine urine ratio   Hepatitis C antibody   Celiac Disease Panel   Type 2 diabetes mellitus without complication, without long-term current use of insulin (HCC)    Uncontrolled. Increase metformin 100057mid. Discussed low dose ARB, ACE-I for renal protection, she will consider this in the future.       Relevant Medications   metFORMIN (GLUCOPHAGE) 500 MG tablet     Other   Elevated alkaline phosphatase level    Patient will scheduled dexa. Pending cmp      Relevant Orders   Comprehensive metabolic panel   DG Bone Density   HLD (hyperlipidemia)    Uncontrolled. Continue crestor 38m47mending lipid panel.          -we discussed possible serious and likely etiologies, options for evaluation and workup, limitations of telemedicine visit vs in person visit, treatment, treatment risks and precautions. Pt prefers to treat via telemedicine empirically rather then risking or undertaking an in person visit at this moment.  .   I discussed the assessment and treatment plan with the patient. The patient was provided an opportunity to ask questions and all were answered.  The patient agreed with the plan and demonstrated an understanding of the instructions.   The patient was advised to call back or seek an in-person evaluation if the symptoms worsen or if the condition fails to improve as anticipated.  I have spent 24mnutes with a patient including precharting,reviewing medical records, and discussion plan of care.      MMable Paris FNP

## 2020-08-15 NOTE — Telephone Encounter (Signed)
Left voicemail needs 3 month follow up/bhp

## 2020-08-15 NOTE — Assessment & Plan Note (Signed)
Uncontrolled. Continue crestor 10mg . Pending lipid panel.

## 2020-08-15 NOTE — Patient Instructions (Addendum)
Start augmentin if needed  Fasting labs  Schedule bone density Please call  and schedule your bone density scan as discussed.   [place]  Bogart Luquillo, [place]

## 2020-08-15 NOTE — Assessment & Plan Note (Signed)
Uncontrolled. Increase metformin 1000mg  bid. Discussed low dose ARB, ACE-I for renal protection, she will consider this in the future.

## 2020-08-15 NOTE — Assessment & Plan Note (Signed)
Afebrile. Duration 6 days. Improving. Advised continued conservative management however if she doesn't continue to improve by Christmas she may start augmentin with probiotics.

## 2020-08-21 ENCOUNTER — Other Ambulatory Visit (INDEPENDENT_AMBULATORY_CARE_PROVIDER_SITE_OTHER): Payer: Managed Care, Other (non HMO)

## 2020-08-21 ENCOUNTER — Other Ambulatory Visit: Payer: Self-pay

## 2020-08-21 DIAGNOSIS — E119 Type 2 diabetes mellitus without complications: Secondary | ICD-10-CM | POA: Diagnosis not present

## 2020-08-21 DIAGNOSIS — R748 Abnormal levels of other serum enzymes: Secondary | ICD-10-CM

## 2020-08-22 LAB — MICROALBUMIN / CREATININE URINE RATIO
Creatinine, Urine: 156.2 mg/dL
Microalb/Creat Ratio: 4 mg/g creat (ref 0–29)
Microalbumin, Urine: 5.7 ug/mL

## 2020-08-23 LAB — COMPREHENSIVE METABOLIC PANEL
ALT: 14 IU/L (ref 0–32)
AST: 15 IU/L (ref 0–40)
Albumin/Globulin Ratio: 2.2 (ref 1.2–2.2)
Albumin: 4.3 g/dL (ref 3.8–4.9)
Alkaline Phosphatase: 123 IU/L — ABNORMAL HIGH (ref 44–121)
BUN/Creatinine Ratio: 10 (ref 9–23)
BUN: 8 mg/dL (ref 6–24)
Bilirubin Total: <0.2 mg/dL (ref 0.0–1.2)
CO2: 26 mmol/L (ref 20–29)
Calcium: 9.4 mg/dL (ref 8.7–10.2)
Chloride: 106 mmol/L (ref 96–106)
Creatinine, Ser: 0.8 mg/dL (ref 0.57–1.00)
GFR calc Af Amer: 97 mL/min/{1.73_m2} (ref >59)
GFR calc non Af Amer: 84 mL/min/{1.73_m2} (ref >59)
Globulin, Total: 2 g/dL (ref 1.5–4.5)
Glucose: 209 mg/dL — ABNORMAL HIGH (ref 65–99)
Potassium: 4.7 mmol/L (ref 3.5–5.2)
Sodium: 145 mmol/L — ABNORMAL HIGH (ref 134–144)
Total Protein: 6.3 g/dL (ref 6.0–8.5)

## 2020-08-23 LAB — LIPID PANEL
Chol/HDL Ratio: 2.3 ratio (ref 0.0–4.4)
Cholesterol, Total: 162 mg/dL (ref 100–199)
HDL: 69 mg/dL (ref >39)
LDL Chol Calc (NIH): 80 mg/dL (ref 0–99)
Triglycerides: 67 mg/dL (ref 0–149)
VLDL Cholesterol Cal: 13 mg/dL (ref 5–40)

## 2020-08-23 LAB — HEPATITIS C ANTIBODY: Hep C Virus Ab: <0.1 s/co ratio (ref 0.0–0.9)

## 2020-08-23 LAB — CELIAC DISEASE PANEL
Endomysial IgA: NEGATIVE
IgA/Immunoglobulin A, Serum: 177 mg/dL (ref 87–352)
Transglutaminase IgA: <2 U/mL (ref 0–3)

## 2020-08-25 ENCOUNTER — Other Ambulatory Visit: Payer: Self-pay | Admitting: Family

## 2020-08-25 DIAGNOSIS — R899 Unspecified abnormal finding in specimens from other organs, systems and tissues: Secondary | ICD-10-CM

## 2020-08-27 ENCOUNTER — Telehealth: Payer: Self-pay

## 2020-08-27 NOTE — Telephone Encounter (Signed)
LMTCB for labs. 

## 2020-09-05 ENCOUNTER — Encounter: Payer: Self-pay | Admitting: Family

## 2020-09-05 ENCOUNTER — Telehealth: Payer: Self-pay

## 2020-09-05 NOTE — Telephone Encounter (Signed)
I spoke with patient to let her know that MAI was only being given to those in dire need with many co morbidities & unvaccinated. Pt was actually feeling better than yesterday when I spoke with her. She was having fatigue, some tickling in her throat with light cough & said at first that chest felt heavy. I went through sign of heart attack with patient & she stated that she had no SOB, no crushing sensation, no left arm pain, numbness or tingling, no vision changes. Did have a HA when first became positive & now gone. She said that heavy wasn't a good word & that she felt like she may have some type of infection, but doesn't have any congestion as of now. I advised that if she did develop any of those sx or SOB that she needed to go to ED or UC ASAP. Pt verbalized understanding. Mucinex & vitamins recommended for patient, which she is already taking most of them. Will keep VV scheduled for tomorrow with Dr. Olivia Mackie.

## 2020-09-05 NOTE — Telephone Encounter (Signed)
I am not doc of the day today

## 2020-09-05 NOTE — Telephone Encounter (Signed)
Consider telling pts to go to [facility] or [facility] if getting worse

## 2020-09-05 NOTE — Telephone Encounter (Signed)
Pt called and states that she tested positive for covid on 09/04/20. No appts avail today but scheduled her for VV with Dr [facility] on 09/06/20. She states that she is a high risk pt and would like to know if they are still providing infusions. Please call today if able

## 2020-09-05 NOTE — Telephone Encounter (Signed)
Ok see appt tomorrow sorry

## 2020-09-05 NOTE — Telephone Encounter (Signed)
Do you think patient would qualify? She is vaccinated.

## 2020-09-05 NOTE — Telephone Encounter (Signed)
Sorry patient was advised to go to ED or UC if any worsening sx or SOB. I didn't include in initial message.

## 2020-09-05 NOTE — Telephone Encounter (Signed)
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 She is not fully vaccinated.  What are her symptoms?  We are having a very hard time getting patients in for MAB and they have to multiple co morbidities and be a high risk for hospital death. They are getting 600 referrals daily with only 6 slots for infusion.    I would advise that we hold on referral unless symptoms severe.   Please however encourage her to get moderna 3rd dose per cdc once symptoms resolve: People age 54+ who are moderately or severely immunocompromised should get an additional primary shot of Moderna COVID-19 vaccine Given 28 days after 2nd shot

## 2020-09-06 ENCOUNTER — Telehealth (INDEPENDENT_AMBULATORY_CARE_PROVIDER_SITE_OTHER): Payer: Managed Care, Other (non HMO) | Admitting: Internal Medicine

## 2020-09-06 ENCOUNTER — Other Ambulatory Visit: Payer: Self-pay

## 2020-09-06 ENCOUNTER — Encounter: Payer: Self-pay | Admitting: Internal Medicine

## 2020-09-06 VITALS — Ht 67.0 in | Wt 188.0 lb

## 2020-09-06 DIAGNOSIS — U071 COVID-19: Secondary | ICD-10-CM

## 2020-09-06 DIAGNOSIS — R0981 Nasal congestion: Secondary | ICD-10-CM | POA: Diagnosis not present

## 2020-09-06 MED ORDER — ALBUTEROL SULFATE HFA 108 (90 BASE) MCG/ACT IN AERS: 2.0000 | INHALATION_SPRAY | Freq: Four times a day (QID) | RESPIRATORY_TRACT | 0 refills | Status: DC | PRN

## 2020-09-06 MED ORDER — PREDNISONE 20 MG PO TABS: 40.0000 mg | ORAL_TABLET | Freq: Every day | ORAL | 0 refills | Status: DC

## 2020-09-06 MED ORDER — DM-GUAIFENESIN ER 60-1200 MG PO TB12: 1.0000 | ORAL_TABLET | Freq: Two times a day (BID) | ORAL | 0 refills | Status: DC

## 2020-09-06 NOTE — Progress Notes (Signed)
Patient tested positive for COVID. This is the second time.  Patient was in high risk category the last time she had Covid.   Symptoms started Monday, fatigue, dizziness, congestion/runny nose, difficulty breathing/lung discomfort, cough. Clear mucous with blowing nose and cough is not productive.

## 2020-09-06 NOTE — Patient Instructions (Addendum)
There is no medication other than over the counter meds: Mucinex dm green label for cough. Vitamin C 1000 mg daily. Vitamin D3 4000 Iu (units) daily. Zinc 100 mg daily. Quercetin 250-500 mg 2 times per day  Monitor pulse oximeter, buy from [facility] if oxygen is less than 90 please go to the hospital. Dillard Cannon of oregano  Are you feeling really sick? Shortness of breath, cough, chest pain, dizziness, confusion? If so let me know  If worsening, go to hospital.

## 2020-09-06 NOTE — Progress Notes (Signed)
Telephone Note  I connected with  on 09/06/20 at  9:00 AM EST by telephone application and verified that I am speaking with the correct person using two identifiers.  Location patient: home, [place] Location provider:work or home office Persons participating in the virtual visit: patient, provider  I discussed the limitations of evaluation and management by telemedicine and the availability of in person appointments. The patient expressed understanding and agreed to proceed.   HPI: covid +09/30/19 s/p MAB infusion and covid +09/03/20 at [employer] [facility] [employer] [facility] [employer]. Pt feels better than she did on Monday sx's for now chest with "infection, w/o cough able to take deep breaths not as congested, denies sob she feels like this is milder than 09/30/19 covid infection   2/2 moderna vaccines utd   ROS: See pertinent positives and negatives per HPI.  Past Medical History:  Diagnosis Date  . Arthritis    Rheumatoid  . Diabetes mellitus without complication ([place])   . Hyperlipidemia   . Tobacco abuse     Past Surgical History:  Procedure Laterality Date  . COLONOSCOPY WITH PROPOFOL N/A 12/19/2016   Procedure: COLONOSCOPY WITH PROPOFOL;  Surgeon: Jonathon Bellows, MD;  Location: [facility] ENDOSCOPY;  Service: Endoscopy;  Laterality: N/A;     Current Outpatient Medications:  .  albuterol (VENTOLIN HFA) 108 (90 Base) MCG/ACT inhaler, Inhale 2 puffs into the lungs every 6 (six) hours as needed for wheezing or shortness of breath., Disp: 8 g, Rfl: 0 .  CALCIUM-MAGNESIUM-ZINC PO, Take 2 tablets by mouth daily., Disp: , Rfl:  .  cholecalciferol (VITAMIN D) 1000 UNITS tablet, Take 1,000 Units by mouth daily., Disp: , Rfl:  .  Dextromethorphan-Guaifenesin 60-1200 MG 12hr tablet, Take 1 tablet by mouth every 12 (twelve) hours., Disp: 30 tablet, Rfl: 0 .  glucose blood (ONE TOUCH ULTRA TEST) test strip, USE AS INSTRUCTED TO TEST BLOOD SUGAR ONCE DAILY, Disp: 100 each, Rfl: 3 .  metFORMIN (GLUCOPHAGE) 500 MG tablet,  Take 2 tablets (1,000 mg total) by mouth 2 (two) times daily with a meal., Disp: 180 tablet, Rfl: 3 .  predniSONE (DELTASONE) 20 MG tablet, Take 2 tablets (40 mg total) by mouth daily with breakfast. x5-10 days, Disp: 20 tablet, Rfl: 0 .  rosuvastatin (CRESTOR) 10 MG tablet, TAKE 1 TABLET(10 MG) BY MOUTH DAILY, Disp: 90 tablet, Rfl: 2 .  XELJANZ XR 11 MG TB24, daily. , Disp: , Rfl:  .  augmented betamethasone dipropionate (DIPROLENE-AF) 0.05 % cream, 2 (two) times daily as needed. (Patient not taking: Reported on 09/06/2020), Disp: , Rfl:   EXAM:  VITALS per patient if applicable:  GENERAL: alert, oriented, appears well and in no acute distress  PSYCH/NEURO: pleasant and cooperative, no obvious depression or anxiety, speech and thought processing grossly intact  ASSESSMENT AND PLAN:  Discussed the following assessment and plan:  COVID-19 09/03/20 had at [facility]- Plan: DG Chest 2 View, Dextromethorphan-Guaifenesin 60-1200 MG 12hr tablet, predniSONE (DELTASONE) 20 MG tablet, albuterol (VENTOLIN HFA) 108 (90 Base) MCG/ACT inhaler 2/2 covid shots disc with pharmacy when can get booster   Sinus congestion  rec otc ah, ns, flonase    Declines CXR for now order if needed   There is no medication other than over the counter meds: Mucinex dm green label for cough. Vitamin C 1000 mg daily. Vitamin D3 4000 Iu (units) daily. Zinc 100 mg daily. Quercetin 250-500 mg 2 times per day  Monitor pulse oximeter, buy from [facility] if oxygen is less than 90 please  go to the hospital. Dillard Cannon of oregano  Are you feeling really sick? Shortness of breath, cough, chest pain, dizziness, confusion? If so let me know  If worsening, go to hospital. -we discussed possible serious and likely etiologies, options for evaluation and workup, limitations of telemedicine visit vs in person visit, treatment, treatment risks and precautions.     I discussed the assessment and treatment plan with the  patient. The patient was provided an opportunity to ask questions and all were answered. The patient agreed with the plan and demonstrated an understanding of the instructions.    Time spent 10 min Delorise Jackson, MD

## 2020-09-28 ENCOUNTER — Telehealth: Payer: Self-pay

## 2020-09-28 ENCOUNTER — Other Ambulatory Visit: Payer: Self-pay | Admitting: Family

## 2020-09-28 DIAGNOSIS — R748 Abnormal levels of other serum enzymes: Secondary | ICD-10-CM

## 2020-09-28 NOTE — Telephone Encounter (Signed)
They need it faxed to [phone].

## 2020-09-28 NOTE — Telephone Encounter (Signed)
I have ordered new DEXA ; please fax to [facility] 

## 2020-09-28 NOTE — Telephone Encounter (Signed)
[facility] is needing an order for pt to get bone density done on 10/05/19 with them. Pt is scheduled to have Bone Density with [facility] on 10/05/19 but she had her last appointment with [facility] and would prefer to go back to them. Olivia Mackie said they just need an order so she can get this done next week.

## 2020-09-28 NOTE — Progress Notes (Incomplete)
I have ordered new DEXA ; please fax to [facility]

## 2020-09-28 NOTE — Telephone Encounter (Signed)
[facility] is needing an order for pt to get bone density done on 10/05/19 with them. Pt is scheduled to have Bone Density with [facility] on 10/05/19 but she had her last appointment with [facility] and would prefer to go back to them. Olivia Mackie said they just need an order so she can get this done next week. they need it faxed to [phone].  Brilynn Biasi,cma

## 2020-10-01 NOTE — Telephone Encounter (Signed)
Ordered, printed and faxed to [facility] clinic today, confirmation given.  Marisah Laker,cma

## 2020-10-03 ENCOUNTER — Encounter: Payer: Self-pay | Admitting: Family

## 2020-10-04 ENCOUNTER — Other Ambulatory Visit: Payer: Managed Care, Other (non HMO)

## 2020-10-05 ENCOUNTER — Telehealth: Payer: Self-pay | Admitting: Family

## 2020-10-05 NOTE — Telephone Encounter (Signed)
Call pt Please let her know that she had a  Normal bone density. No osteoporosis

## 2020-10-05 NOTE — Telephone Encounter (Signed)
Patient was informed of results 

## 2020-11-14 ENCOUNTER — Ambulatory Visit: Payer: Managed Care, Other (non HMO) | Admitting: Family

## 2020-11-16 ENCOUNTER — Encounter: Payer: Self-pay | Admitting: Family

## 2020-11-16 ENCOUNTER — Telehealth (INDEPENDENT_AMBULATORY_CARE_PROVIDER_SITE_OTHER): Payer: Managed Care, Other (non HMO) | Admitting: Family

## 2020-11-16 VITALS — Ht 67.01 in | Wt 184.0 lb

## 2020-11-16 DIAGNOSIS — E119 Type 2 diabetes mellitus without complications: Secondary | ICD-10-CM

## 2020-11-16 DIAGNOSIS — R748 Abnormal levels of other serum enzymes: Secondary | ICD-10-CM | POA: Diagnosis not present

## 2020-11-16 DIAGNOSIS — E7800 Pure hypercholesterolemia, unspecified: Secondary | ICD-10-CM

## 2020-11-16 MED ORDER — METFORMIN HCL 500 MG PO TABS: ORAL_TABLET | ORAL | 3 refills | Status: DC

## 2020-11-16 NOTE — Assessment & Plan Note (Signed)
DEXA- normal bone density 10/04/20. If alk phos remains elevated, will refer to endocrine.

## 2020-11-16 NOTE — Assessment & Plan Note (Signed)
Anticipate improved. Pending a1c. Continue metformin 500mg  qam, 1000mg  qhs. We discussed rybelsus or, ozempic versus jardiance. She will research these medications and we will discuss after a1c returns.

## 2020-11-16 NOTE — Assessment & Plan Note (Signed)
Controlled. Continue crestor 10mg 

## 2020-11-16 NOTE — Progress Notes (Signed)
Virtual Visit via Video Note  I connected with@  on 11/16/20 at  9:00 AM EDT by a video enabled telemedicine application and verified that I am speaking with the correct person using two identifiers.  Location patient: home Location provider:work  Persons participating in the virtual visit: patient, provider  I discussed the limitations of evaluation and management by telemedicine and the availability of in person appointments. The patient expressed understanding and agreed to proceed.   HPI: Feels well today No complaints.    H/o Elevated alk phos  DM- compliant with metformin 500mg  qam and 1000mg  qpm. No personal or family h/o thyroid cancer.   HLD- compliant with crestor 10mg   DEXA- normal bone density 10/04/20 Mammogram UTD       ROS: See pertinent positives and negatives per HPI.    EXAM:  VITALS per patient if applicable: Ht 5' 6.80" (1.702 m)   Wt 184 lb (83.5 kg)   BMI 28.81 kg/m  BP Readings from Last 3 Encounters:  08/15/20 125/75  05/28/20 124/80  02/06/20 122/78   Wt Readings from Last 3 Encounters:  11/16/20 184 lb (83.5 kg)  09/06/20 188 lb (85.3 kg)  08/15/20 185 lb (83.9 kg)    GENERAL: alert, oriented, appears well and in no acute distress  HEENT: atraumatic, conjunttiva clear, no obvious abnormalities on inspection of external nose and ears  NECK: normal movements of the head and neck  LUNGS: on inspection no signs of respiratory distress, breathing rate appears normal, no obvious gross SOB, gasping or wheezing  CV: no obvious cyanosis  MS: moves all visible extremities without noticeable abnormality  PSYCH/NEURO: pleasant and cooperative, no obvious depression or anxiety, speech and thought processing grossly intact  ASSESSMENT AND PLAN:  Discussed the following assessment and plan:  Problem List Items Addressed This Visit      Endocrine   Diabetes mellitus, new onset ([place]) - Primary (Chronic)   Relevant Medications    metFORMIN (GLUCOPHAGE) 500 MG tablet   Other Relevant Orders   Alkaline phosphatase, isoenzymes   Gamma GT   Hemoglobin A1c   Type 2 diabetes mellitus without complication, without long-term current use of insulin (HCC)    Anticipate improved. Pending a1c. Continue metformin 500mg  qam, 1000mg  qhs. We discussed rybelsus or, ozempic versus jardiance. She will research these medications and we will discuss after a1c returns.        Relevant Medications   metFORMIN (GLUCOPHAGE) 500 MG tablet     Other   Elevated alkaline phosphatase level    DEXA- normal bone density 10/04/20. If alk phos remains elevated, will refer to endocrine.       HLD (hyperlipidemia)    Controlled. Continue crestor 10mg          -we discussed possible serious and likely etiologies, options for evaluation and workup, limitations of telemedicine visit vs in person visit, treatment, treatment risks and precautions. Pt prefers to treat via telemedicine empirically rather then risking or undertaking an in person visit at this moment.  .   I discussed the assessment and treatment plan with the patient. The patient was provided an opportunity to ask questions and all were answered. The patient agreed with the plan and demonstrated an understanding of the instructions.   The patient was advised to call back or seek an in-person evaluation if the symptoms worsen or if the condition fails to improve as anticipated.   Mable Paris, FNP

## 2020-12-06 ENCOUNTER — Other Ambulatory Visit (INDEPENDENT_AMBULATORY_CARE_PROVIDER_SITE_OTHER): Payer: Managed Care, Other (non HMO)

## 2020-12-06 ENCOUNTER — Other Ambulatory Visit: Payer: Self-pay

## 2020-12-06 DIAGNOSIS — E119 Type 2 diabetes mellitus without complications: Secondary | ICD-10-CM

## 2020-12-10 LAB — ALKALINE PHOSPHATASE, ISOENZYMES
Alkaline Phosphatase: 113 IU/L (ref 44–121)
BONE FRACTION: 50 % (ref 14–68)
INTESTINAL FRAC.: 1 % (ref 0–18)
LIVER FRACTION: 49 % (ref 18–85)

## 2020-12-10 LAB — GAMMA GT: GGT: 18 IU/L (ref 0–60)

## 2020-12-10 LAB — HEMOGLOBIN A1C
Est. average glucose Bld gHb Est-mCnc: 203 mg/dL
Hgb A1c MFr Bld: 8.7 % — ABNORMAL HIGH (ref 4.8–5.6)

## 2020-12-14 ENCOUNTER — Telehealth: Payer: Self-pay

## 2020-12-14 NOTE — Telephone Encounter (Signed)
LMTCB for lab results.  

## 2020-12-17 ENCOUNTER — Other Ambulatory Visit: Payer: Self-pay | Admitting: Family

## 2020-12-17 MED ORDER — VARENICLINE TARTRATE 1 MG PO TABS: ORAL_TABLET | ORAL | 0 refills | Status: DC

## 2020-12-17 NOTE — Progress Notes (Unsigned)
Call pt  Please ensure she is following LOW carb and sugar diet If she would like to see nutritionist let me know Ensure she has 3 month follow up  I sent in chantix to [facility] and mail the below instructions to her  The below is how you take Chantix.  Please tell family , close that you are starting Chantix so they can not only support you however also make sure you do not show any unsual thoughts, behavior- this is rare however I want you to be vigilant.    Make a plan to slowly decrease smoking as you are on the chantix until your quit date. ( see below for detailed instructions).    Be mindful of  common side effects- mainly GI upset, so please TAKE with FOOD. You may also have strange dreams, however this too is less common.   Select a quit date within 7 days of starting Chantix. Goal is you should stop smoking within 8 to 35 days of starting chantix  Initial: Days 1 to 3: 0.5 mg by mouth once daily Days 4 to 7: 0.5 mg by mouth twice daily   Maintenance (? Day 8): 1 mg by mouth twice daily for 11 weeks. We may consider a temporary or permanent dose reduction if usual dose of 1 mg twice per day is not tolerated.   Slow decrease of smoking:   If you are not able or willing to quit abruptly, begin treatment with vareniciline ( Chantix) and reduce smoking by 50% from baseline within the first 4 weeks, by an additional 50% in the next 4 weeks, and continue reducing with the goal of complete abstinence by 12 weeks. If successfully quits smoking at the end of the 12 weeks, may continue for another 12 weeks to help maintain success. If you are motivated to quit and do not succeed in stopping smoking during prior therapy, or relapse after treatment, I would encourage you to make another attempt with varenicline ( Chantix) once factors contributing to the failed attempt have been identified and addressed.

## 2020-12-17 NOTE — Progress Notes (Signed)
LMTCB

## 2020-12-18 ENCOUNTER — Telehealth: Payer: Self-pay | Admitting: Family

## 2020-12-18 NOTE — Progress Notes (Signed)
Pt called and below was reviewed. Pt is comfortable taking medication as she has had prior success with this. She is scheduled to f/u in 3 months.

## 2020-12-18 NOTE — Telephone Encounter (Signed)
Pt called and advised on how to take Chantix & to please call us back ASAP if any issues or behavioral changes. Pt stated that she has taken before & did well on medication. Pt scheduled for 3 month f/u.

## 2020-12-18 NOTE — Telephone Encounter (Signed)
Patient was returning call about results

## 2021-03-15 ENCOUNTER — Ambulatory Visit: Payer: Managed Care, Other (non HMO) | Admitting: Family

## 2021-04-02 ENCOUNTER — Encounter: Payer: Self-pay | Admitting: Family

## 2021-04-12 ENCOUNTER — Ambulatory Visit: Payer: Managed Care, Other (non HMO) | Admitting: Family

## 2021-06-04 ENCOUNTER — Other Ambulatory Visit: Payer: Self-pay | Admitting: Family

## 2021-06-04 DIAGNOSIS — Z1231 Encounter for screening mammogram for malignant neoplasm of breast: Secondary | ICD-10-CM

## 2021-06-04 DIAGNOSIS — E119 Type 2 diabetes mellitus without complications: Secondary | ICD-10-CM

## 2021-06-07 ENCOUNTER — Other Ambulatory Visit: Payer: Self-pay

## 2021-06-07 ENCOUNTER — Ambulatory Visit: Payer: Managed Care, Other (non HMO) | Admitting: Family

## 2021-06-07 ENCOUNTER — Encounter: Payer: Self-pay | Admitting: Family

## 2021-06-07 VITALS — BP 126/88 | HR 71 | Temp 98.1°F | Ht 67.0 in | Wt 185.0 lb

## 2021-06-07 DIAGNOSIS — Z87891 Personal history of nicotine dependence: Secondary | ICD-10-CM | POA: Diagnosis not present

## 2021-06-07 DIAGNOSIS — Z23 Encounter for immunization: Secondary | ICD-10-CM | POA: Diagnosis not present

## 2021-06-07 DIAGNOSIS — F17200 Nicotine dependence, unspecified, uncomplicated: Secondary | ICD-10-CM

## 2021-06-07 DIAGNOSIS — E7800 Pure hypercholesterolemia, unspecified: Secondary | ICD-10-CM

## 2021-06-07 DIAGNOSIS — E119 Type 2 diabetes mellitus without complications: Secondary | ICD-10-CM | POA: Diagnosis not present

## 2021-06-07 LAB — POCT GLYCOSYLATED HEMOGLOBIN (HGB A1C): Hemoglobin A1C: 7.2 % — AB (ref 4.0–5.6)

## 2021-06-07 MED ORDER — GLUCOSE BLOOD VI STRP: ORAL_STRIP | 3 refills | Status: DC

## 2021-06-07 NOTE — Patient Instructions (Signed)
Again, so very impressed that you are able to quit smoking.  Referral to pulmonology to start lung cancer screening program with low-dose CT of your chest done annually. Let us know if you dont hear back within a week in regards to an appointment being scheduled.   Please monitor blood pressure as discussed  It is imperative that you are seen AT least twice per year for labs and monitoring. Monitor blood pressure at home and me 5-6 reading on separate days. Goal is less than 120/80, based on newest guidelines, however we certainly want to be less than 130/80;  if persistently higher, please make sooner follow up appointment so we can recheck you blood pressure and manage/ adjust medications.  Please occasionally spot check fasting blood glucose in the morning with the goal being less than 120.  If any blood sugar less than 70 is considered hypoglycemic.  Please call with any concerns or escalations

## 2021-06-07 NOTE — Assessment & Plan Note (Signed)
Congratulated patient on cessation of smoking in May of this year.  Counseled on the importance CT lung cancer screening program, patient is very agreeable to starting this program.  Referral has been placed to [facility]

## 2021-06-07 NOTE — Progress Notes (Signed)
Subjective:    Patient ID: Regina Barnes, female    DOB: Jan 17, 1966, 55 y.o.   MRN: [id number]  CC: Regina Barnes is a 55 y.o. female who presents today for follow up.   HPI: She has stopped smoking May 2022. She plans to refll chantix and take 0.5mg  for 6-8 weeks as she feels increased stress at work and she is worried that she may be tempted to resume smoking   DM-compliant with metformin 500 mg every morning and 1000 mg Hyperlipidemia-compliant with Crestor 10 mg  Alkaline phosphatase had normalized 12/10/2020  HISTORY:  Past Medical History:  Diagnosis Date   Arthritis    Rheumatoid   COVID-19    09/2019, 09/03/20   Diabetes mellitus without complication ([place])    Hyperlipidemia    Tobacco abuse    Past Surgical History:  Procedure Laterality Date   COLONOSCOPY WITH PROPOFOL N/A 12/19/2016   Procedure: COLONOSCOPY WITH PROPOFOL;  Surgeon: Jonathon Bellows, MD;  Location: [facility] ENDOSCOPY;  Service: Endoscopy;  Laterality: N/A;   Family History  Problem Relation Age of Onset   Heart attack Father 55   Diabetes Other    Breast cancer Maternal Aunt        great aunt >50   Breast cancer Paternal Aunt        great aunt   Hyperlipidemia Mother    Kidney disease Mother    Thyroid cancer Neg Hx     Allergies: Codeine Current Outpatient Medications on File Prior to Visit  Medication Sig Dispense Refill   augmented betamethasone dipropionate (DIPROLENE-AF) 0.05 % cream 2 (two) times daily as needed.     cholecalciferol (VITAMIN D) 1000 UNITS tablet Take 1,000 Units by mouth daily.     metFORMIN (GLUCOPHAGE) 500 MG tablet TAKE 1 TABLET BY MOUTH IN  THE MORNING AND  2 TABLETS  AT BEDTIME 270 tablet 3   rosuvastatin (CRESTOR) 10 MG tablet TAKE 1 TABLET(10 MG) BY MOUTH DAILY 90 tablet 2   varenicline (CHANTIX) 1 MG tablet TAKE 1/2 TABLET BY MOUTH DAILY FOR 3 DAYS, THEN 1/2 TABLET TWICE DAILY FOR 4 DAYS, THEN 1 TABLET TWICE DAILY THEREAFTER 167 tablet 1   XELJANZ XR 11 MG TB24 daily.       CALCIUM-MAGNESIUM-ZINC PO Take 2 tablets by mouth daily. (Patient not taking: Reported on 06/07/2021)     Dextromethorphan-Guaifenesin 60-1200 MG 12hr tablet Take 1 tablet by mouth every 12 (twelve) hours. (Patient not taking: Reported on 06/07/2021) 30 tablet 0   No current facility-administered medications on file prior to visit.    Social History   Tobacco Use   Smoking status: Former    Packs/day: 0.30    Years: 20.00    Pack years: 6.00    Types: Cigarettes    Quit date: 12/23/2020    Years since quitting: 0.4   Smokeless tobacco: Never  Substance Use Topics   Alcohol use: Yes    Alcohol/week: 4.0 standard drinks    Types: 4 Cans of beer per week   Drug use: No    Review of Systems  Constitutional:  Negative for chills and fever.  Respiratory:  Negative for cough.   Cardiovascular:  Negative for chest pain and palpitations.  Gastrointestinal:  Negative for nausea and vomiting.     Objective:    BP 126/88 (BP Location: Left Arm, Patient Position: Sitting, Cuff Size: Normal)   Pulse 71   Temp 98.1 F (36.7 C) (Oral)   Ht 5\' 7"  (  1.702 m)   Wt 185 lb (83.9 kg)   SpO2 97%   BMI 28.98 kg/m  BP Readings from Last 3 Encounters:  06/07/21 126/88  08/15/20 125/75  05/28/20 124/80   Wt Readings from Last 3 Encounters:  06/07/21 185 lb (83.9 kg)  11/16/20 184 lb (83.5 kg)  09/06/20 188 lb (85.3 kg)    Physical Exam Vitals reviewed.  Constitutional:      Appearance: She is well-developed.  Eyes:     Conjunctiva/sclera: Conjunctivae normal.  Cardiovascular:     Rate and Rhythm: Normal rate and regular rhythm.     Pulses: Normal pulses.     Heart sounds: Normal heart sounds.  Pulmonary:     Effort: Pulmonary effort is normal.     Breath sounds: Normal breath sounds. No wheezing, rhonchi or rales.  Skin:    General: Skin is warm and dry.  Neurological:     Mental Status: She is alert.  Psychiatric:        Speech: Speech normal.        Behavior: Behavior  normal.        Thought Content: Thought content normal.       Assessment & Plan:   Problem List Items Addressed This Visit       Endocrine   Diabetes mellitus, new onset ([place]) - Primary (Chronic)   Relevant Medications   glucose blood (ONE TOUCH ULTRA TEST) test strip   Other Relevant Orders   POCT HgB A1C (Completed)   Comprehensive metabolic panel   Lipid panel   Microalbumin / creatinine urine ratio   Type 2 diabetes mellitus without complication, without long-term current use of insulin (HCC)     Significantly improved.  Patient is most comfortable with continuing lifestyle changes including weight loss following low glycemic diet and staying on current regimen with metformin.  She declines increasing at this time.  Continue metformin 500 mg every morning, 1000 mg every afternoon.        Other   Former smoker    Congratulated patient on cessation of smoking in May of this year.  Counseled on the importance CT lung cancer screening program, patient is very agreeable to starting this program.  Referral has been placed to [facility]      Hyperlipidemia    Anticipate improved.  Pending lipid panel.  Continue Crestor 10 mg      Other Visit Diagnoses     Need for immunization against influenza       Relevant Orders   Flu Vaccine QUAD 27mo+IM (Fluarix, Fluzone & Alfiuria Quad PF) (Completed)   Smoker       Relevant Orders   Ambulatory Referral for Lung Cancer Scre        I am having Regina Barnes. Tremper maintain her cholecalciferol, CALCIUM-MAGNESIUM-ZINC PO, Xeljanz XR, augmented betamethasone dipropionate, rosuvastatin, Dextromethorphan-Guaifenesin, varenicline, metFORMIN, and glucose blood.   Meds ordered this encounter  Medications   glucose blood (ONE TOUCH ULTRA TEST) test strip    Sig: USE AS INSTRUCTED TO TEST BLOOD SUGAR ONCE DAILY    Dispense:  100 each    Refill:  3    Order Specific Question:   Supervising Provider    Answer:   Crecencio Mc [[id number]]      Return precautions given.   Risks, benefits, and alternatives of the medications and treatment plan prescribed today were discussed, and patient expressed understanding.   Education regarding symptom management and diagnosis given to patient on AVS.  Continue to follow with Burnard Hawthorne, FNP for routine health maintenance.   Agustina Caroli and I agreed with plan.   Mable Paris, FNP

## 2021-06-07 NOTE — Assessment & Plan Note (Signed)
Significantly improved.  Patient is most comfortable with continuing lifestyle changes including weight loss following low glycemic diet and staying on current regimen with metformin.  She declines increasing at this time.  Continue metformin 500 mg every morning, 1000 mg every afternoon.

## 2021-06-07 NOTE — Assessment & Plan Note (Signed)
Anticipate improved.  Pending lipid panel.  Continue Crestor 10 mg

## 2021-06-08 LAB — COMPREHENSIVE METABOLIC PANEL
ALT: 15 IU/L (ref 0–32)
AST: 18 IU/L (ref 0–40)
Albumin/Globulin Ratio: 2 (ref 1.2–2.2)
Albumin: 4.7 g/dL (ref 3.8–4.9)
Alkaline Phosphatase: 118 IU/L (ref 44–121)
BUN/Creatinine Ratio: 19 (ref 9–23)
BUN: 15 mg/dL (ref 6–24)
Bilirubin Total: 0.3 mg/dL (ref 0.0–1.2)
CO2: 25 mmol/L (ref 20–29)
Calcium: 10.3 mg/dL — ABNORMAL HIGH (ref 8.7–10.2)
Chloride: 102 mmol/L (ref 96–106)
Creatinine, Ser: 0.8 mg/dL (ref 0.57–1.00)
Globulin, Total: 2.4 g/dL (ref 1.5–4.5)
Glucose: 132 mg/dL — ABNORMAL HIGH (ref 70–99)
Potassium: 4.4 mmol/L (ref 3.5–5.2)
Sodium: 141 mmol/L (ref 134–144)
Total Protein: 7.1 g/dL (ref 6.0–8.5)
eGFR: 88 mL/min/{1.73_m2} (ref >59)

## 2021-06-08 LAB — MICROALBUMIN / CREATININE URINE RATIO
Creatinine, Urine: 75.2 mg/dL
Microalb/Creat Ratio: <4 mg/g creat (ref 0–29)
Microalbumin, Urine: <3 ug/mL

## 2021-06-08 LAB — LIPID PANEL
Chol/HDL Ratio: 2.3 ratio (ref 0.0–4.4)
Cholesterol, Total: 182 mg/dL (ref 100–199)
HDL: 79 mg/dL (ref >39)
LDL Chol Calc (NIH): 86 mg/dL (ref 0–99)
Triglycerides: 95 mg/dL (ref 0–149)
VLDL Cholesterol Cal: 17 mg/dL (ref 5–40)

## 2021-06-11 ENCOUNTER — Other Ambulatory Visit: Payer: Self-pay | Admitting: Family

## 2021-06-13 ENCOUNTER — Other Ambulatory Visit: Payer: Self-pay

## 2021-06-13 MED ORDER — CONTOUR NEXT MONITOR W/DEVICE KIT: 1.0000 | PACK | Freq: Every day | 0 refills | Status: AC | PRN

## 2021-06-13 MED ORDER — LANCETS MISC: 4 refills | Status: AC

## 2021-06-13 MED ORDER — CONTOUR NEXT TEST VI STRP: ORAL_STRIP | 4 refills | Status: DC

## 2021-06-28 ENCOUNTER — Other Ambulatory Visit: Payer: Self-pay

## 2021-06-28 ENCOUNTER — Ambulatory Visit
Admission: RE | Admit: 2021-06-28 | Discharge: 2021-06-28 | Disposition: A | Discharge status: Home / Self Care | Payer: Managed Care, Other (non HMO) | Source: Ambulatory Visit | Attending: Family | Admitting: Family

## 2021-06-28 DIAGNOSIS — Z1231 Encounter for screening mammogram for malignant neoplasm of breast: Secondary | ICD-10-CM | POA: Insufficient documentation

## 2021-07-04 ENCOUNTER — Other Ambulatory Visit: Payer: Self-pay

## 2021-07-04 ENCOUNTER — Ambulatory Visit: Payer: Managed Care, Other (non HMO) | Admitting: Internal Medicine

## 2021-07-04 ENCOUNTER — Encounter: Payer: Self-pay | Admitting: Internal Medicine

## 2021-07-04 VITALS — BP 130/90 | HR 84 | Ht 67.0 in | Wt 183.0 lb

## 2021-07-04 DIAGNOSIS — Z8249 Family history of ischemic heart disease and other diseases of the circulatory system: Secondary | ICD-10-CM | POA: Diagnosis not present

## 2021-07-04 DIAGNOSIS — R030 Elevated blood-pressure reading, without diagnosis of hypertension: Secondary | ICD-10-CM | POA: Diagnosis not present

## 2021-07-04 DIAGNOSIS — E1169 Type 2 diabetes mellitus with other specified complication: Secondary | ICD-10-CM

## 2021-07-04 DIAGNOSIS — E785 Hyperlipidemia, unspecified: Secondary | ICD-10-CM | POA: Diagnosis not present

## 2021-07-04 MED ORDER — ROSUVASTATIN CALCIUM 10 MG PO TABS: 10.0000 mg | ORAL_TABLET | Freq: Every day | ORAL | 3 refills | Status: DC

## 2021-07-04 NOTE — Patient Instructions (Signed)
Medication Instructions:   Your physician recommends that you continue on your current medications as directed. Please refer to the Current Medication list given to you today.  *If you need a refill on your cardiac medications before your next appointment, please call your pharmacy*   Lab Work:  None ordered  Testing/Procedures:  None ordered   Follow-Up: At [facility], you and your health needs are our priority.  As part of our continuing mission to provide you with exceptional heart care, we have created designated Provider Care Teams.  These Care Teams include your primary Cardiologist (physician) and Advanced Practice Providers (APPs -  Physician Assistants and Nurse Practitioners) who all work together to provide you with the care you need, when you need it.  We recommend signing up for the patient portal called "MyChart".  Sign up information is provided on this After Visit Summary.  MyChart is used to connect with patients for Virtual Visits (Telemedicine).  Patients are able to view lab/test results, encounter notes, upcoming appointments, etc.  Non-urgent messages can be sent to your provider as well.   To learn more about what you can do with MyChart, go to [email or web address].    Your next appointment:   1 year(s)  The format for your next appointment:   In Person  Provider:   You may see Dr. Harrell Gave End or one of the following Advanced Practice Providers on your designated Care Team:   Murray Hodgkins, NP Christell Faith, PA-C Cadence Kathlen Mody, PA-C1}    Other Instructions  Please let us know if your blood pressure is consistently greater than 130/80   DASH Eating Plan DASH stands for Dietary Approaches to Stop Hypertension. The DASH eating plan is a healthy eating plan that has been shown to: Reduce high blood pressure (hypertension). Reduce your risk for type 2 diabetes, heart disease, and stroke. Help with weight loss. What are tips for following this  plan? Reading food labels Check food labels for the amount of salt (sodium) per serving. Choose foods with less than 5 percent of the Daily Value of sodium. Generally, foods with less than 300 milligrams (mg) of sodium per serving fit into this eating plan. To find whole grains, look for the word "whole" as the first word in the ingredient list. Shopping Buy products labeled as "low-sodium" or "no salt added." Buy fresh foods. Avoid canned foods and pre-made or frozen meals. Cooking Avoid adding salt when cooking. Use salt-free seasonings or herbs instead of table salt or sea salt. Check with your health care provider or pharmacist before using salt substitutes. Do not fry foods. Cook foods using healthy methods such as baking, boiling, grilling, roasting, and broiling instead. Cook with heart-healthy oils, such as olive, canola, avocado, soybean, or sunflower oil. Meal planning  Eat a balanced diet that includes: 4 or more servings of fruits and 4 or more servings of vegetables each day. Try to fill one-half of your plate with fruits and vegetables. 6-8 servings of whole grains each day. Less than 6 oz (170 g) of lean meat, poultry, or fish each day. A 3-oz (85-g) serving of meat is about the same size as a deck of cards. One egg equals 1 oz (28 g). 2-3 servings of low-fat dairy each day. One serving is 1 cup (237 mL). 1 serving of nuts, seeds, or beans 5 times each week. 2-3 servings of heart-healthy fats. Healthy fats called omega-3 fatty acids are found in foods such as walnuts, flaxseeds, fortified milks, and  eggs. These fats are also found in cold-water fish, such as sardines, salmon, and mackerel. Limit how much you eat of: Canned or prepackaged foods. Food that is high in trans fat, such as some fried foods. Food that is high in saturated fat, such as fatty meat. Desserts and other sweets, sugary drinks, and other foods with added sugar. Full-fat dairy products. Do not salt foods  before eating. Do not eat more than 4 egg yolks a week. Try to eat at least 2 vegetarian meals a week. Eat more home-cooked food and less restaurant, buffet, and fast food. Lifestyle When eating at a restaurant, ask that your food be prepared with less salt or no salt, if possible. If you drink alcohol: Limit how much you use to: 0-1 drink a day for women who are not pregnant. 0-2 drinks a day for men. Be aware of how much alcohol is in your drink. In the [place] one drink equals one 12 oz bottle of beer (355 mL), one 5 oz glass of wine (148 mL), or one 1 oz glass of hard liquor (44 mL). General information Avoid eating more than 2,300 mg of salt a day. If you have hypertension, you may need to reduce your sodium intake to 1,500 mg a day. Work with your health care provider to maintain a healthy body weight or to lose weight. Ask what an ideal weight is for you. Get at least 30 minutes of exercise that causes your heart to beat faster (aerobic exercise) most days of the week. Activities may include walking, swimming, or biking. Work with your health care provider or dietitian to adjust your eating plan to your individual calorie needs. What foods should I eat? Fruits All fresh, dried, or frozen fruit. Canned fruit in natural juice (without added sugar). Vegetables Fresh or frozen vegetables (raw, steamed, roasted, or grilled). Low-sodium or reduced-sodium tomato and vegetable juice. Low-sodium or reduced-sodium tomato sauce and tomato paste. Low-sodium or reduced-sodium canned vegetables. Grains Whole-grain or whole-wheat bread. Whole-grain or whole-wheat pasta. Brown rice. [place]. Bulgur. Whole-grain and low-sodium cereals. Pita bread. Low-fat, low-sodium crackers. Whole-wheat flour tortillas. Meats and other proteins Skinless chicken or [place]. Ground chicken or [place]. Pork with fat trimmed off. Fish and seafood. Egg whites. Dried beans, peas, or lentils. Unsalted nuts, nut  butters, and seeds. Unsalted canned beans. Lean cuts of beef with fat trimmed off. Low-sodium, lean precooked or cured meat, such as sausages or meat loaves. Dairy Low-fat (1%) or fat-free (skim) milk. Reduced-fat, low-fat, or fat-free cheeses. Nonfat, low-sodium ricotta or cottage cheese. Low-fat or nonfat yogurt. Low-fat, low-sodium cheese. Fats and oils Soft margarine without trans fats. Vegetable oil. Reduced-fat, low-fat, or light mayonnaise and salad dressings (reduced-sodium). Canola, safflower, olive, avocado, soybean, and sunflower oils. Avocado. Seasonings and condiments Herbs. Spices. Seasoning mixes without salt. Other foods Unsalted popcorn and pretzels. Fat-free sweets. The items listed above may not be a complete list of foods and beverages you can eat. Contact a dietitian for more information. What foods should I avoid? Fruits Canned fruit in a light or heavy syrup. Fried fruit. Fruit in cream or butter sauce. Vegetables Creamed or fried vegetables. Vegetables in a cheese sauce. Regular canned vegetables (not low-sodium or reduced-sodium). Regular canned tomato sauce and paste (not low-sodium or reduced-sodium). Regular tomato and vegetable juice (not low-sodium or reduced-sodium). Angie Fava. Olives. Grains Baked goods made with fat, such as croissants, muffins, or some breads. Dry pasta or rice meal packs. Meats and other proteins Fatty cuts of  meat. Ribs. Fried meat. Berniece Salines. Bologna, salami, and other precooked or cured meats, such as sausages or meat loaves. Fat from the back of a pig (fatback). Bratwurst. Salted nuts and seeds. Canned beans with added salt. Canned or smoked fish. Whole eggs or egg yolks. Chicken or [place] with skin. Dairy Whole or 2% milk, cream, and half-and-half. Whole or full-fat cream cheese. Whole-fat or sweetened yogurt. Full-fat cheese. Nondairy creamers. Whipped toppings. Processed cheese and cheese spreads. Fats and oils Butter. Stick margarine. Lard.  Shortening. Ghee. Bacon fat. Tropical oils, such as coconut, palm kernel, or palm oil. Seasonings and condiments Onion salt, garlic salt, seasoned salt, table salt, and sea salt. Worcestershire sauce. Tartar sauce. Barbecue sauce. Teriyaki sauce. Soy sauce, including reduced-sodium. Steak sauce. Canned and packaged gravies. Fish sauce. Oyster sauce. Cocktail sauce. Store-bought horseradish. Ketchup. Mustard. Meat flavorings and tenderizers. Bouillon cubes. Hot sauces. Pre-made or packaged marinades. Pre-made or packaged taco seasonings. Relishes. Regular salad dressings. Other foods Salted popcorn and pretzels. The items listed above may not be a complete list of foods and beverages you should avoid. Contact a dietitian for more information. Where to find more information National Heart, Lung, and Blood Institute: https://wilson-eaton.com/ American Heart Association: www.heart.org Academy of Nutrition and Dietetics: www.eatright.[place]: www.kidney.org Summary The DASH eating plan is a healthy eating plan that has been shown to reduce high blood pressure (hypertension). It may also reduce your risk for type 2 diabetes, heart disease, and stroke. When on the DASH eating plan, aim to eat more fresh fruits and vegetables, whole grains, lean proteins, low-fat dairy, and heart-healthy fats. With the DASH eating plan, you should limit salt (sodium) intake to 2,300 mg a day. If you have hypertension, you may need to reduce your sodium intake to 1,500 mg a day. Work with your health care provider or dietitian to adjust your eating plan to your individual calorie needs. This information is not intended to replace advice given to you by your health care provider. Make sure you discuss any questions you have with your health care provider. Document Revised: 07/15/2019 Document Reviewed: 07/15/2019 Elsevier Patient Education  2022 [employer].

## 2021-07-04 NOTE — Progress Notes (Signed)
Follow-up Outpatient Visit Date: 07/04/2021  Primary Care Provider: Burnard Hawthorne, FNP [address] [place] [address]  Chief Complaint: Follow-up hyperlipidemia  HPI:  Ms. Dezeeuw is a 55 y.o. female with history of  type 2 diabetes mellitus, hyperlipidemia, rheumatoid arthritis, COVID-19 infection, tobacco use, and family history of premature coronary artery disease, who presents for follow-up of hyperlipidemia and coronary artery disease risk.  I last saw her in 05/2020, at which time she was doing well without any symptoms.  She was smoking about 1/3 pack/day due to stress at work.  We discussed smoking cessation strategies.  No medication changes or additional testing were pursued at that time.  Today, Ms. Kunkler reports that she has been feeling well.  She denies chest pain, shortness of breath, and palpitations.  She has quit smoking but remains on Chantix to help remain abstinent.  She is tolerating rosuvastatin well.  She is trying to walk regularly and also limit her sodium intake.  She notes her home blood pressures are typically around the 130s/80s.  --------------------------------------------------------------------------------------------------  Cardiovascular History & Procedures: Cardiovascular Problems: Multiple cardiovascular risk factors   Risk Factors: Hyperlipidemia, diabetes mellitus, tobacco use, and family history   Cath/PCI: None   CV Surgery: None   EP Procedures and Devices: None   Non-Invasive Evaluation(s): None  Recent CV Pertinent Labs: Lab Results  Component Value Date   CHOL 182 06/07/2021   HDL 79 06/07/2021   LDLCALC 86 06/07/2021   TRIG 95 06/07/2021   CHOLHDL 2.3 06/07/2021   K 4.4 06/07/2021   BUN 15 06/07/2021   CREATININE 0.80 06/07/2021    Past medical and surgical history were reviewed and updated in EPIC.  Current Meds  Medication Sig   augmented betamethasone dipropionate (DIPROLENE-AF) 0.05 %  cream 2 (two) times daily as needed.   Blood Glucose Monitoring Suppl (CONTOUR NEXT MONITOR) w/Device KIT 1 Device by Does not apply route daily as needed.   cholecalciferol (VITAMIN D) 1000 UNITS tablet Take 1,000 Units by mouth daily.   glucose blood (CONTOUR NEXT TEST) test strip Used to check blood sugars one time a day.   Lancets MISC Used to check blood sugars one time a day. DX E11.9.   metFORMIN (GLUCOPHAGE) 1000 MG tablet Take 1,000 mg by mouth every evening.   rosuvastatin (CRESTOR) 10 MG tablet TAKE 1 TABLET(10 MG) BY MOUTH DAILY   varenicline (CHANTIX) 1 MG tablet Take 1 mg by mouth daily.   XELJANZ XR 11 MG TB24 daily.     Allergies: Codeine  Social History   Tobacco Use   Smoking status: Former    Packs/day: 0.30    Years: 20.00    Pack years: 6.00    Types: Cigarettes    Quit date: 12/23/2020    Years since quitting: 0.5   Smokeless tobacco: Never  Vaping Use   Vaping Use: Former   Devices: tried once  Substance Use Topics   Alcohol use: Yes    Alcohol/week: 4.0 standard drinks    Types: 4 Cans of beer per week    Comment: weekly   Drug use: No    Family History  Problem Relation Age of Onset   Heart attack Father 78   Diabetes Other    Breast cancer Maternal Aunt        great aunt >50   Breast cancer Paternal Aunt        great aunt   Hyperlipidemia Mother    Kidney  disease Mother    Thyroid cancer Neg Hx     Review of Systems: A 12-system review of systems was performed and was negative except as noted in the HPI.  --------------------------------------------------------------------------------------------------  Physical Exam: BP 130/90 (BP Location: Left Arm, Patient Position: Sitting, Cuff Size: Normal)   Pulse 84   Ht 5' 7"  (1.702 m)   Wt 183 lb (83 kg)   SpO2 98%   BMI 28.66 kg/m   General:  NAD. Neck: No JVD or HJR. Lungs: Clear to auscultation bilaterally without wheezes or crackles. Heart: Regular rate and rhythm without murmurs,  rubs, or gallops. Abdomen: Soft, nontender, nondistended. Extremities: No lower extremity edema.  EKG: Normal sinus rhythm without abnormality.  Lab Results  Component Value Date   WBC 8.8 12/23/2019   HGB 12.5 12/23/2019   HCT 37.2 12/23/2019   MCV 94 12/23/2019   PLT 211 12/23/2019    Lab Results  Component Value Date   NA 141 06/07/2021   K 4.4 06/07/2021   CL 102 06/07/2021   CO2 25 06/07/2021   BUN 15 06/07/2021   CREATININE 0.80 06/07/2021   GLUCOSE 132 (H) 06/07/2021   ALT 15 06/07/2021    Lab Results  Component Value Date   CHOL 182 06/07/2021   HDL 79 06/07/2021   LDLCALC 86 06/07/2021   TRIG 95 06/07/2021   CHOLHDL 2.3 06/07/2021    --------------------------------------------------------------------------------------------------  ASSESSMENT AND PLAN: Hyperlipidemia associated with type 2 diabetes mellitus: Lipid panel last month normal with LDL of 86 and triglycerides of 95.  Given history of diabetes mellitus and family history of ASCVD, I think it is reasonable to continue with rosuvastatin 10 mg daily.  Family history of ASCVD: No symptoms reported.  We discussed utility of further risk stratification with coronary calcium scoring but have agreed to defer this.  Elevated blood pressure: Blood pressure mildly elevated today (goal less than 130/80), consistent with prior home readings.  I have reinforced importance of sodium restriction and exercise.  If blood pressure remains consistently elevated, pharmacotherapy will need to be considered (favor ACE inhibitor/ARB with history of DM).  I have provided Ms. Charlies Silvers with information regarding the DASH diet.  She should let us know if her blood pressure remains consistently above 130/80.  Follow-up: Return to clinic in 1 year.  Nelva Bush, MD 07/04/2021 10:38 AM

## 2022-03-20 DIAGNOSIS — L4050 Arthropathic psoriasis, unspecified: Secondary | ICD-10-CM | POA: Insufficient documentation

## 2022-03-20 DIAGNOSIS — Z7960 Long term (current) use of unspecified immunomodulators and immunosuppressants: Secondary | ICD-10-CM | POA: Insufficient documentation

## 2022-03-20 DIAGNOSIS — L409 Psoriasis, unspecified: Secondary | ICD-10-CM | POA: Insufficient documentation

## 2022-04-11 LAB — HM DIABETES EYE EXAM

## 2022-04-24 NOTE — Progress Notes (Signed)
Abstract  

## 2022-05-08 ENCOUNTER — Other Ambulatory Visit: Payer: Self-pay | Admitting: Family

## 2022-05-08 DIAGNOSIS — E119 Type 2 diabetes mellitus without complications: Secondary | ICD-10-CM

## 2022-07-15 ENCOUNTER — Other Ambulatory Visit: Payer: Self-pay | Admitting: Family

## 2022-07-15 DIAGNOSIS — Z1231 Encounter for screening mammogram for malignant neoplasm of breast: Secondary | ICD-10-CM

## 2022-07-31 ENCOUNTER — Other Ambulatory Visit
Admission: RE | Admit: 2022-07-31 | Discharge: 2022-07-31 | Disposition: A | Discharge status: Home / Self Care | Payer: Managed Care, Other (non HMO) | Source: Ambulatory Visit | Attending: Internal Medicine | Admitting: Internal Medicine

## 2022-07-31 ENCOUNTER — Encounter: Payer: Self-pay | Admitting: Internal Medicine

## 2022-07-31 ENCOUNTER — Ambulatory Visit: Payer: Managed Care, Other (non HMO) | Attending: Internal Medicine | Admitting: Internal Medicine

## 2022-07-31 VITALS — BP 136/88 | HR 76 | Ht 66.75 in | Wt 188.0 lb

## 2022-07-31 DIAGNOSIS — Z79899 Other long term (current) drug therapy: Secondary | ICD-10-CM

## 2022-07-31 DIAGNOSIS — E785 Hyperlipidemia, unspecified: Secondary | ICD-10-CM | POA: Insufficient documentation

## 2022-07-31 DIAGNOSIS — I10 Essential (primary) hypertension: Secondary | ICD-10-CM

## 2022-07-31 DIAGNOSIS — E1169 Type 2 diabetes mellitus with other specified complication: Secondary | ICD-10-CM

## 2022-07-31 LAB — LIPID PANEL
Cholesterol: 261 mg/dL — ABNORMAL HIGH (ref 0–200)
HDL: 83 mg/dL (ref >40)
LDL Cholesterol: 160 mg/dL — ABNORMAL HIGH (ref 0–99)
Total CHOL/HDL Ratio: 3.1 RATIO
Triglycerides: 90 mg/dL (ref <150)
VLDL: 18 mg/dL (ref 0–40)

## 2022-07-31 LAB — COMPREHENSIVE METABOLIC PANEL
ALT: 20 U/L (ref 0–44)
AST: 25 U/L (ref 15–41)
Albumin: 4.2 g/dL (ref 3.5–5.0)
Alkaline Phosphatase: 86 U/L (ref 38–126)
Anion gap: 4 — ABNORMAL LOW (ref 5–15)
BUN: 15 mg/dL (ref 6–20)
CO2: 29 mmol/L (ref 22–32)
Calcium: 9.5 mg/dL (ref 8.9–10.3)
Chloride: 107 mmol/L (ref 98–111)
Creatinine, Ser: 0.78 mg/dL (ref 0.44–1.00)
GFR, Estimated: >60 mL/min (ref >60)
Glucose, Bld: 159 mg/dL — ABNORMAL HIGH (ref 70–99)
Potassium: 4.1 mmol/L (ref 3.5–5.1)
Sodium: 140 mmol/L (ref 135–145)
Total Bilirubin: 0.8 mg/dL (ref 0.3–1.2)
Total Protein: 7.1 g/dL (ref 6.5–8.1)

## 2022-07-31 LAB — HEMOGLOBIN A1C
Hgb A1c MFr Bld: 7.9 % — ABNORMAL HIGH (ref 4.8–5.6)
Mean Plasma Glucose: 180 mg/dL

## 2022-07-31 NOTE — Progress Notes (Signed)
 Follow-up Outpatient Visit Date: 07/31/2022  Primary Care Provider: Arnett, Margaret G, FNP [address] [place] [address]  Chief Complaint: Follow-up hyperlipidemia  HPI:  Regina Barnes is a 56 y.o. female with history of type 2 diabetes mellitus, essential hypertension, hyperlipidemia, rheumatoid arthritis, COVID-19 infection, tobacco use, and family history of premature coronary artery disease, who presents for follow-up of hyperlipidemia and coronary artery disease risk.  I last saw her in 06/2021, at which time she was feeling well.  She had quit smoking but continued to use Chantix to remain abstinent from tobacco use.  Blood pressure was mildly elevated.  We agreed to focus on sodium restriction and lifestyle modifications to help improve her BP.  We did not make any medication changes or pursue additional testing.  Today, Regina Barnes is without specific complaints though she feels like she has been under quite a bit of stress at work.  She has not been exercising regularly.  She is planning to quit her job in March to "focus on herself."  She has not followed with her PCP in over a year.  She is tolerating current dose of rosuvastatin well.  She has not had any chest pain, shortness of breath, palpitations, lightheadedness, or edema.  Prior leg "achiness" has improved with use of more supportive shoes.  --------------------------------------------------------------------------------------------------  Cardiovascular History & Procedures: Cardiovascular Problems: Multiple cardiovascular risk factors   Risk Factors: Hyperlipidemia, diabetes mellitus, tobacco use, and family history   Cath/PCI: None   CV Surgery: None   EP Procedures and Devices: None   Non-Invasive Evaluation(s): None  Recent CV Pertinent Labs: Lab Results  Component Value Date   CHOL 182 06/07/2021   HDL 79 06/07/2021   LDLCALC 86 06/07/2021   TRIG 95 06/07/2021   CHOLHDL 2.3 06/07/2021    K 4.4 06/07/2021   BUN 15 06/07/2021   CREATININE 0.80 06/07/2021    Past medical and surgical history were reviewed and updated in EPIC.  Current Meds  Medication Sig   augmented betamethasone dipropionate (DIPROLENE-AF) 0.05 % cream 2 (two) times daily as needed.   Blood Glucose Monitoring Suppl (CONTOUR NEXT MONITOR) w/Device KIT 1 Device by Does not apply route daily as needed.   cholecalciferol (VITAMIN D) 1000 UNITS tablet Take 1,000 Units by mouth daily.   glucose blood (CONTOUR NEXT TEST) test strip Used to check blood sugars one time a day.   Lancets MISC Used to check blood sugars one time a day. DX E11.9.   metFORMIN (GLUCOPHAGE) 1000 MG tablet Take 1,000 mg by mouth every evening.   rosuvastatin (CRESTOR) 10 MG tablet Take 1 tablet (10 mg total) by mouth daily.   XELJANZ XR 11 MG TB24 daily.     Allergies: Codeine  Social History   Tobacco Use   Smoking status: Former    Packs/day: 0.30    Years: 20.00    Total pack years: 6.00    Types: Cigarettes    Quit date: 12/23/2020    Years since quitting: 1.6   Smokeless tobacco: Never  Vaping Use   Vaping Use: Former   Devices: tried once  Substance Use Topics   Alcohol use: Yes    Alcohol/week: 4.0 standard drinks of alcohol    Types: 4 Cans of beer per week    Comment: weekly   Drug use: No    Family History  Problem Relation Age of Onset   Heart attack Father 45   Diabetes Other      Breast cancer Maternal Aunt        great aunt >50   Breast cancer Paternal Aunt        great aunt   Hyperlipidemia Mother    Kidney disease Mother    Thyroid cancer Neg Hx     Review of Systems: A 12-system review of systems was performed and was negative except as noted in the HPI.  --------------------------------------------------------------------------------------------------  Physical Exam: BP 136/88 (BP Location: Left Arm)   Pulse 76   Ht 5' 6.75" (1.695 m)   Wt 188 lb (85.3 kg)   SpO2 98%   BMI 29.67 kg/m   Repeat BP: 136/88  General:  NAD. Neck: No JVD or HJR. Lungs: Clear to auscultation bilaterally without wheezes or crackles. Heart: Regular rate and rhythm without murmurs, rubs, or gallops. Abdomen: Soft, nontender, nondistended. Extremities: No lower extremity edema.  EKG: Normal sinus rhythm with borderline LVH.  Lab Results  Component Value Date   WBC 8.8 12/23/2019   HGB 12.5 12/23/2019   HCT 37.2 12/23/2019   MCV 94 12/23/2019   PLT 211 12/23/2019    Lab Results  Component Value Date   NA 141 06/07/2021   K 4.4 06/07/2021   CL 102 06/07/2021   CO2 25 06/07/2021   BUN 15 06/07/2021   CREATININE 0.80 06/07/2021   GLUCOSE 132 (H) 06/07/2021   ALT 15 06/07/2021    Lab Results  Component Value Date   CHOL 182 06/07/2021   HDL 79 06/07/2021   LDLCALC 86 06/07/2021   TRIG 95 06/07/2021   CHOLHDL 2.3 06/07/2021    --------------------------------------------------------------------------------------------------  ASSESSMENT AND PLAN: Hyperlipidemia associated with type 2 diabetes mellitus: Regina Barnes is tolerating rosuvastatin 10 mg daily well.  Leg pains have improved with more supportive shoe use.  As it has been over a year since her last lipid panel, we will check a lipid panel and CMP today.  I will also check a hemoglobin A1c in anticipation of her upcoming visit with Ms. Arnett.  I encouraged Regina Barnes to increase her activity as tolerated.  Hypertension: Blood pressure suboptimally controlled today (goal less than 130/80).  We discussed addition of antihypertensive, cotherapy, though Regina Barnes wishes to defer at this in favor of lifestyle modifications.  I have counseled her on the importance of sodium restriction and provided her with information about the DASH diet.  She should monitor her blood pressure at home and alert [place] if it is consistently above 130/80.  Follow-up: Return to clinic in 6 months.  Nelva Bush, MD 07/31/2022 10:21 AM

## 2022-07-31 NOTE — Patient Instructions (Signed)
Medication Instructions:  Your Physician recommend you continue on your current medication as directed.    *If you need a refill on your cardiac medications before your next appointment, please call your pharmacy*   Lab Work: Your provider would like for you to have following labs drawn today: (CMP, Lipid, Hgb A1C).   Please go to the [facility] entrance and check in at the front desk.  You do not need an appointment.  They are open from 7am-6 pm.   If you have labs (blood work) drawn today and your tests are completely normal, you will receive your results only by: [place] (if you have MyChart) OR A paper copy in the mail If you have any lab test that is abnormal or we need to change your treatment, we will call you to review the results.   Testing/Procedures: None ordered today   Follow-Up: At [facility], you and your health needs are our priority.  As part of our continuing mission to provide you with exceptional heart care, we have created designated Provider Care Teams.  These Care Teams include your primary Cardiologist (physician) and Advanced Practice Providers (APPs -  Physician Assistants and Nurse Practitioners) who all work together to provide you with the care you need, when you need it.  We recommend signing up for the patient portal called "MyChart".  Sign up information is provided on this After Visit Summary.  MyChart is used to connect with patients for Virtual Visits (Telemedicine).  Patients are able to view lab/test results, encounter notes, upcoming appointments, etc.  Non-urgent messages can be sent to your provider as well.   To learn more about what you can do with MyChart, go to [email or web address].    Your next appointment:   6 month(s)  The format for your next appointment:   In Person  Provider:   You may see Nelva Bush, MD or one of the following Advanced Practice Providers on your designated Care Team:    Murray Hodgkins, NP Christell Faith, PA-C Cadence Kathlen Mody, PA-C Gerrie Nordmann, NP

## 2022-08-01 ENCOUNTER — Other Ambulatory Visit: Payer: Self-pay

## 2022-08-01 ENCOUNTER — Encounter: Payer: Self-pay | Admitting: Internal Medicine

## 2022-08-01 ENCOUNTER — Telehealth: Payer: Self-pay | Admitting: Internal Medicine

## 2022-08-01 DIAGNOSIS — Z79899 Other long term (current) drug therapy: Secondary | ICD-10-CM

## 2022-08-01 DIAGNOSIS — I10 Essential (primary) hypertension: Secondary | ICD-10-CM | POA: Insufficient documentation

## 2022-08-01 MED ORDER — ROSUVASTATIN CALCIUM 10 MG PO TABS: 10.0000 mg | ORAL_TABLET | Freq: Every day | ORAL | 3 refills | Status: DC

## 2022-08-01 NOTE — Telephone Encounter (Signed)
Pt updated with MD's recommendations and verbalized understanding. Repeat lab order placed   Regina Bush, MD 08/01/2022  3:21 PM EST     It is okay to refill current dose of atorvastatin if she declines recommended dose increase.  I suggest that [name] work on lifestyle modifications to help improve her moderately elevated LDL with repeat fasting lipid panel shortly before our visit in June.

## 2022-08-01 NOTE — Telephone Encounter (Signed)
Patient is returning call regarding lab results/recommendations.

## 2022-08-04 NOTE — Progress Notes (Signed)
Assessment & Plan:  Routine physical examination Assessment & Plan: Clinical breast exam performed today.  Pap obtained. Patient would qualify for [place] lung cancer screening program.  Referral has been placed. She declines PCV20.  Encouraged exercise program  Orders: -     Pap LB (liquid-based) -     HPV Aptima  Cervical cancer screening -     Pap LB (liquid-based) -     HPV Aptima  Essential hypertension -     Microalbumin / creatinine urine ratio  Colon cancer screening  Screening for lung cancer -     Ambulatory Referral for Lung Cancer Scre  Diabetes mellitus, new onset ([place]) -     Microalbumin / creatinine urine ratio -     Hemoglobin A1c; Future  Type 2 diabetes mellitus without complication, without long-term current use of insulin [facility]) Assessment & Plan: Lab Results  Component Value Date   HGBA1C 7.9 (H) 07/31/2022   Uncontrolled. Discussed increasing metformin 1049m qd to BID. She declines and would like to work on lifestyle changes.       Return precautions given.   Risks, benefits, and alternatives of the medications and treatment plan prescribed today were discussed, and patient expressed understanding.   Education regarding symptom management and diagnosis given to patient on AVS either electronically or printed.  No follow-ups on file.  Regina Barnes  Subjective:    Patient ID: Regina Barnes Barnes    DOB: 3Feb 13, 1967 56y.o.   MRN: [id number] CC: Regina Barnes who presents today for physical exam.    HPI: Feels well today.  No new complaints   Consult Dr. ESaunders Revel 07/31/2022 whom recommended increasing rosuvastatin to 20 mg . She declined increasing rosuvastatin as would prefer to work on lifestyle.  DM-compliant metformin 1000 mg daily.  She plans to work more diligently on diet   Colorectal Cancer Screening: UTD, dr aVicente Males4/27/2018, repeat in 10 years Breast Cancer Screening: Mammogram scheduled Cervical Cancer  Screening: due, previous 03/22/2020 negative malignancy,HPV Bone Health screening/DEXA for 65+: No increased fracture risk. Defer screening at this time.  Lung Cancer Screening: former smoker Declines screening labs today. Exercise: No regular exercise.   Alcohol use:  weekly  Declines PVC20     Health Maintenance  Topic Date Due   PAP SMEAR-Modifier  03/22/2022   Diabetic kidney evaluation - Urine ACR  06/07/2022   COVID-19 Vaccine (3 - Moderna risk series) 08/28/2022 (Originally 04/27/2020)   Zoster Vaccines- Shingrix (1 of 2) 11/11/2022 (Originally 11/02/1985)   INFLUENZA VACCINE  11/23/2022 (Originally 03/25/2022)   HEMOGLOBIN A1C  01/30/2023   OPHTHALMOLOGY EXAM  04/12/2023   MAMMOGRAM  06/29/2023   Diabetic kidney evaluation - eGFR measurement  08/01/2023   FOOT EXAM  08/13/2023   COLONOSCOPY (Pts 45-468yrInsurance coverage will need to be confirmed)  12/20/2026   DTaP/Tdap/Td (2 - Td or Tdap) 03/22/2029   Hepatitis C Screening  Completed   HIV Screening  Completed   HPV VACCINES  Aged Out    ALLERGIES: Codeine  Current Outpatient Medications on File Prior to Visit  Medication Sig Dispense Refill   augmented betamethasone dipropionate (DIPROLENE-AF) 0.05 % cream 2 (two) times daily as needed.     Blood Glucose Monitoring Suppl (CONTOUR NEXT MONITOR) w/Device KIT 1 Device by Does not apply route daily as needed. 1 kit 0   cholecalciferol (VITAMIN D) 1000 UNITS tablet Take 1,000 Units by mouth daily.  glucose blood (CONTOUR NEXT TEST) test strip Used to check blood sugars one time a day. 100 each 4   Lancets MISC Used to check blood sugars one time a day. DX E11.9. 100 each 4   metFORMIN (GLUCOPHAGE) 1000 MG tablet Take 1,000 mg by mouth every evening.     rosuvastatin (CRESTOR) 10 MG tablet Take 1 tablet (10 mg total) by mouth daily. 90 tablet 3   XELJANZ XR 11 MG TB24 daily.      No current facility-administered medications on file prior to visit.    Review of  Systems  Constitutional:  Negative for chills, fever and unexpected weight change.  HENT:  Negative for congestion.   Respiratory:  Negative for cough.   Cardiovascular:  Negative for chest pain, palpitations and leg swelling.  Gastrointestinal:  Negative for nausea and vomiting.  Musculoskeletal:  Negative for arthralgias and myalgias.  Skin:  Negative for rash.  Neurological:  Negative for headaches.  Hematological:  Negative for adenopathy.  Psychiatric/Behavioral:  Negative for confusion.       Objective:    BP 118/88   Pulse 69   Temp (!) 97 F (36.1 C) (Oral)   Ht 5' 5.5" (1.664 m)   Wt 187 lb 3.2 oz (84.9 kg)   LMP  (LMP Unknown)   SpO2 97%   BMI 30.68 kg/m   BP Readings from Last 3 Encounters:  08/12/22 118/88  07/31/22 136/88  07/04/21 130/90   Wt Readings from Last 3 Encounters:  08/12/22 187 lb 3.2 oz (84.9 kg)  07/31/22 188 lb (85.3 kg)  07/04/21 183 lb (83 kg)    Physical Exam Vitals reviewed.  Constitutional:      Appearance: Normal appearance. She is well-developed.  Eyes:     Conjunctiva/sclera: Conjunctivae normal.  Neck:     Thyroid: No thyroid mass or thyromegaly.  Cardiovascular:     Rate and Rhythm: Normal rate and regular rhythm.     Pulses: Normal pulses.     Heart sounds: Normal heart sounds.  Pulmonary:     Effort: Pulmonary effort is normal.     Breath sounds: Normal breath sounds. No wheezing, rhonchi or rales.  Chest:  Breasts:    Breasts are symmetrical.     Right: No inverted nipple, mass, nipple discharge, skin change or tenderness.     Left: No inverted nipple, mass, nipple discharge, skin change or tenderness.  Abdominal:     General: Bowel sounds are normal. There is no distension.     Palpations: Abdomen is soft. Abdomen is not rigid. There is no fluid wave or mass.     Tenderness: There is no abdominal tenderness. There is no guarding or rebound.  Genitourinary:    Cervix: No cervical motion tenderness, discharge or  friability.     Uterus: Not enlarged, not fixed and not tender.      Adnexa:        Right: No mass, tenderness or fullness.         Left: No mass, tenderness or fullness.       Comments: Pap performed. No CMT. Unable to appreciated ovaries. Lymphadenopathy:     Head:     Right side of head: No submental, submandibular, tonsillar, preauricular, posterior auricular or occipital adenopathy.     Left side of head: No submental, submandibular, tonsillar, preauricular, posterior auricular or occipital adenopathy.     Cervical:     Right cervical: No superficial, deep or posterior cervical adenopathy.  Left cervical: No superficial, deep or posterior cervical adenopathy.     Upper Body:     Right upper body: No pectoral adenopathy.     Left upper body: No pectoral adenopathy.  Skin:    General: Skin is warm and dry.  Neurological:     Mental Status: She is alert.  Psychiatric:        Speech: Speech normal.        Behavior: Behavior normal.        Thought Content: Thought content normal.

## 2022-08-12 ENCOUNTER — Ambulatory Visit (INDEPENDENT_AMBULATORY_CARE_PROVIDER_SITE_OTHER): Payer: Managed Care, Other (non HMO) | Admitting: Family

## 2022-08-12 ENCOUNTER — Encounter: Payer: Self-pay | Admitting: Family

## 2022-08-12 VITALS — BP 118/88 | HR 69 | Temp 97.0°F | Ht 65.5 in | Wt 187.2 lb

## 2022-08-12 DIAGNOSIS — Z122 Encounter for screening for malignant neoplasm of respiratory organs: Secondary | ICD-10-CM

## 2022-08-12 DIAGNOSIS — Z0000 Encounter for general adult medical examination without abnormal findings: Secondary | ICD-10-CM | POA: Diagnosis not present

## 2022-08-12 DIAGNOSIS — Z124 Encounter for screening for malignant neoplasm of cervix: Secondary | ICD-10-CM | POA: Diagnosis not present

## 2022-08-12 DIAGNOSIS — I10 Essential (primary) hypertension: Secondary | ICD-10-CM

## 2022-08-12 DIAGNOSIS — E119 Type 2 diabetes mellitus without complications: Secondary | ICD-10-CM

## 2022-08-12 DIAGNOSIS — Z1211 Encounter for screening for malignant neoplasm of colon: Secondary | ICD-10-CM | POA: Diagnosis not present

## 2022-08-12 NOTE — Patient Instructions (Signed)
I placed a referral to [facility] pulmonology  for annual lung cancer screening program.  You should receive a phone call from the office to discuss the test and to qualify you for the program.  If you do not receive a phone call from their office in the next 2 weeks, please [place] [facility] at : [phone].  If you have any issues in scheduling or speaking with pulmonology, please let me know  Nice to see you!  Health Maintenance for Postmenopausal Women Menopause is a normal process in which your ability to get pregnant comes to an end. This process happens slowly over many months or years, usually between the ages of 66 and 57. Menopause is complete when you have missed your menstrual period for 12 months. It is important to talk with your health care provider about some of the most common conditions that affect women after menopause (postmenopausal women). These include heart disease, cancer, and bone loss (osteoporosis). Adopting a healthy lifestyle and getting preventive care can help to promote your health and wellness. The actions you take can also lower your chances of developing some of these common conditions. What are the signs and symptoms of menopause? During menopause, you may have the following symptoms: Hot flashes. These can be moderate or severe. Night sweats. Decrease in sex drive. Mood swings. Headaches. Tiredness (fatigue). Irritability. Memory problems. Problems falling asleep or staying asleep. Talk with your health care provider about treatment options for your symptoms. Do I need hormone replacement therapy? Hormone replacement therapy is effective in treating symptoms that are caused by menopause, such as hot flashes and night sweats. Hormone replacement carries certain risks, especially as you become older. If you are thinking about using estrogen or estrogen with progestin, discuss the benefits and risks with your health care provider. How can I reduce my risk  for heart disease and stroke? The risk of heart disease, heart attack, and stroke increases as you age. One of the causes may be a change in the body's hormones during menopause. This can affect how your body uses dietary fats, triglycerides, and cholesterol. Heart attack and stroke are medical emergencies. There are many things that you can do to help prevent heart disease and stroke. Watch your blood pressure High blood pressure causes heart disease and increases the risk of stroke. This is more likely to develop in people who have high blood pressure readings or are overweight. Have your blood pressure checked: Every 3-5 years if you are 94-60 years of age. Every year if you are 37 years old or older. Eat a healthy diet  Eat a diet that includes plenty of vegetables, fruits, low-fat dairy products, and lean protein. Do not eat a lot of foods that are high in solid fats, added sugars, or sodium. Get regular exercise Get regular exercise. This is one of the most important things you can do for your health. Most adults should: Try to exercise for at least 150 minutes each week. The exercise should increase your heart rate and make you sweat (moderate-intensity exercise). Try to do strengthening exercises at least twice each week. Do these in addition to the moderate-intensity exercise. Spend less time sitting. Even light physical activity can be beneficial. Other tips Work with your health care provider to achieve or maintain a healthy weight. Do not use any products that contain nicotine or tobacco. These products include cigarettes, chewing tobacco, and vaping devices, such as e-cigarettes. If you need help quitting, ask your health care provider.  Know your numbers. Ask your health care provider to check your cholesterol and your blood sugar (glucose). Continue to have your blood tested as directed by your health care provider. Do I need screening for cancer? Depending on your health history  and family history, you may need to have cancer screenings at different stages of your life. This may include screening for: Breast cancer. Cervical cancer. Lung cancer. Colorectal cancer. What is my risk for osteoporosis? After menopause, you may be at increased risk for osteoporosis. Osteoporosis is a condition in which bone destruction happens more quickly than new bone creation. To help prevent osteoporosis or the bone fractures that can happen because of osteoporosis, you may take the following actions: If you are 55-85 years old, get at least 1,000 mg of calcium and at least 600 international units (IU) of vitamin D per day. If you are older than age 64 but younger than age 72, get at least 1,200 mg of calcium and at least 600 international units (IU) of vitamin D per day. If you are older than age 51, get at least 1,200 mg of calcium and at least 800 international units (IU) of vitamin D per day. Smoking and drinking excessive alcohol increase the risk of osteoporosis. Eat foods that are rich in calcium and vitamin D, and do weight-bearing exercises several times each week as directed by your health care provider. How does menopause affect my mental health? Depression may occur at any age, but it is more common as you become older. Common symptoms of depression include: Feeling depressed. Changes in sleep patterns. Changes in appetite or eating patterns. Feeling an overall lack of motivation or enjoyment of activities that you previously enjoyed. Frequent crying spells. Talk with your health care provider if you think that you are experiencing any of these symptoms. General instructions See your health care provider for regular wellness exams and vaccines. This may include: Scheduling regular health, dental, and eye exams. Getting and maintaining your vaccines. These include: Influenza vaccine. Get this vaccine each year before the flu season begins. Pneumonia vaccine. Shingles  vaccine. Tetanus, diphtheria, and pertussis (Tdap) booster vaccine. Your health care provider may also recommend other immunizations. Tell your health care provider if you have ever been abused or do not feel safe at home. Summary Menopause is a normal process in which your ability to get pregnant comes to an end. This condition causes hot flashes, night sweats, decreased interest in sex, mood swings, headaches, or lack of sleep. Treatment for this condition may include hormone replacement therapy. Take actions to keep yourself healthy, including exercising regularly, eating a healthy diet, watching your weight, and checking your blood pressure and blood sugar levels. Get screened for cancer and depression. Make sure that you are up to date with all your vaccines. This information is not intended to replace advice given to you by your health care provider. Make sure you discuss any questions you have with your health care provider. Document Revised: 12/31/2020 Document Reviewed: 12/31/2020 Elsevier Patient Education  [employer].

## 2022-08-12 NOTE — Assessment & Plan Note (Signed)
Lab Results  Component Value Date   HGBA1C 7.9 (H) 07/31/2022   Uncontrolled. Discussed increasing metformin '1000mg'$  qd to BID. She declines and would like to work on lifestyle changes.

## 2022-08-12 NOTE — Assessment & Plan Note (Signed)
Clinical breast exam performed today.  Pap obtained. Patient would qualify for [place] lung cancer screening program.  Referral has been placed. She declines PCV20.  Encouraged exercise program

## 2022-08-13 LAB — MICROALBUMIN / CREATININE URINE RATIO
Creatinine, Urine: 124.1 mg/dL
Microalb/Creat Ratio: 3 mg/g creat (ref 0–29)
Microalbumin, Urine: 3.4 ug/mL

## 2022-08-16 LAB — PAP IG (IMAGE GUIDED)

## 2022-08-16 LAB — HPV APTIMA: HPV Aptima: NEGATIVE

## 2022-08-21 ENCOUNTER — Ambulatory Visit
Admission: RE | Admit: 2022-08-21 | Discharge: 2022-08-21 | Disposition: A | Discharge status: Home / Self Care | Payer: Managed Care, Other (non HMO) | Source: Ambulatory Visit | Attending: Family | Admitting: Family

## 2022-08-21 DIAGNOSIS — Z1231 Encounter for screening mammogram for malignant neoplasm of breast: Secondary | ICD-10-CM | POA: Diagnosis present

## 2022-09-26 ENCOUNTER — Encounter: Payer: Self-pay | Admitting: Family

## 2022-09-26 ENCOUNTER — Other Ambulatory Visit: Payer: Self-pay

## 2022-09-26 ENCOUNTER — Ambulatory Visit: Payer: Managed Care, Other (non HMO) | Admitting: Family

## 2022-09-26 VITALS — BP 128/76 | HR 71 | Temp 98.3°F | Ht 67.0 in | Wt 185.4 lb

## 2022-09-26 DIAGNOSIS — J329 Chronic sinusitis, unspecified: Secondary | ICD-10-CM | POA: Insufficient documentation

## 2022-09-26 DIAGNOSIS — J0100 Acute maxillary sinusitis, unspecified: Secondary | ICD-10-CM

## 2022-09-26 DIAGNOSIS — E119 Type 2 diabetes mellitus without complications: Secondary | ICD-10-CM

## 2022-09-26 MED ORDER — AMOXICILLIN-POT CLAVULANATE 875-125 MG PO TABS: 1.0000 | ORAL_TABLET | Freq: Two times a day (BID) | ORAL | 0 refills | Status: AC

## 2022-09-26 MED ORDER — CONTOUR NEXT TEST VI STRP: ORAL_STRIP | 4 refills | Status: DC

## 2022-09-26 NOTE — Assessment & Plan Note (Signed)
Afebrile.  Suspect previous sinus congestion 3 weeks ago is likely lingering.  Concern for bacterial infection at this time.  Start Augmentin, probiotics.  Patient will start over-the-counter Flonase.  If symptoms do not resolve, we discussed prednisone taper.

## 2022-09-26 NOTE — Progress Notes (Signed)
Assessment & Plan:  Acute non-recurrent maxillary sinusitis Assessment & Plan: Afebrile.  Suspect previous sinus congestion 3 weeks ago is likely lingering.  Concern for bacterial infection at this time.  Start Augmentin, probiotics.  Patient will start over-the-counter Flonase.  If symptoms do not resolve, we discussed prednisone taper.   Orders: -     Amoxicillin-Pot Clavulanate; Take 1 tablet by mouth 2 (two) times daily for 7 days.  Dispense: 14 tablet; Refill: 0  Type 2 diabetes mellitus without complication, without long-term current use of insulin (HCC) -     Insulin, random     Return precautions given.   Risks, benefits, and alternatives of the medications and treatment plan prescribed today were discussed, and patient expressed understanding.   Education regarding symptom management and diagnosis given to patient on AVS either electronically or printed.  No follow-ups on file.  Mable Paris, FNP  Subjective:    Patient ID: Regina Barnes, female    DOB: 1966/05/23, 57 y.o.   MRN: [id number]  CC: Regina Barnes is a 57 y.o. female who presents today for an acute visit.    HPI: Complains  of bilateral earache, HA, sinus pressure x one week.   HA is not worse of life.   No fever, vision changes, cough.   Ibuprofen with some relief; she uses '400mg'$  qd.   3 weeks ago had nasal congestion, sinus pressure and concerned for residual  No recent antibiotics.   Endorses stress from work     Allergies: Codeine Current Outpatient Medications on File Prior to Visit  Medication Sig Dispense Refill   augmented betamethasone dipropionate (DIPROLENE-AF) 0.05 % cream 2 (two) times daily as needed.     Blood Glucose Monitoring Suppl (CONTOUR NEXT MONITOR) w/Device KIT 1 Device by Does not apply route daily as needed. 1 kit 0   cholecalciferol (VITAMIN D) 1000 UNITS tablet Take 1,000 Units by mouth daily.     Lancets MISC Used to check blood sugars one time a day. DX  E11.9. 100 each 4   metFORMIN (GLUCOPHAGE) 1000 MG tablet Take 1,000 mg by mouth every evening.     rosuvastatin (CRESTOR) 10 MG tablet Take 1 tablet (10 mg total) by mouth daily. 90 tablet 3   XELJANZ XR 11 MG TB24 daily.      No current facility-administered medications on file prior to visit.    Review of Systems  Constitutional:  Negative for chills and fever.  HENT:  Positive for ear pain and sinus pain. Negative for congestion and sore throat.   Eyes:  Negative for visual disturbance.  Respiratory:  Negative for cough.   Cardiovascular:  Negative for chest pain and palpitations.  Gastrointestinal:  Negative for nausea and vomiting.  Neurological:  Positive for headaches.      Objective:    BP 128/76   Pulse 71   Temp 98.3 F (36.8 C) (Oral)   Ht '5\' 7"'$  (1.702 m)   Wt 185 lb 6.4 oz (84.1 kg)   LMP  (LMP Unknown)   SpO2 99%   BMI 29.04 kg/m   BP Readings from Last 3 Encounters:  09/26/22 128/76  08/12/22 118/88  07/31/22 136/88   Wt Readings from Last 3 Encounters:  09/26/22 185 lb 6.4 oz (84.1 kg)  08/12/22 187 lb 3.2 oz (84.9 kg)  07/31/22 188 lb (85.3 kg)    Physical Exam Vitals reviewed.  Constitutional:      Appearance: She is well-developed.  HENT:  Head: Normocephalic and atraumatic.     Right Ear: Hearing, tympanic membrane, ear canal and external ear normal. No decreased hearing noted. No drainage, swelling or tenderness. No middle ear effusion. No foreign body. Tympanic membrane is not erythematous or bulging.     Left Ear: Hearing, tympanic membrane, ear canal and external ear normal. No decreased hearing noted. No drainage, swelling or tenderness.  No middle ear effusion. No foreign body. Tympanic membrane is not erythematous or bulging.     Nose: No rhinorrhea.     Right Sinus: Maxillary sinus tenderness present. No frontal sinus tenderness.     Left Sinus: Maxillary sinus tenderness present. No frontal sinus tenderness.     Mouth/Throat:      Pharynx: Uvula midline. No oropharyngeal exudate or posterior oropharyngeal erythema.     Tonsils: No tonsillar abscesses.  Eyes:     Conjunctiva/sclera: Conjunctivae normal.  Cardiovascular:     Rate and Rhythm: Regular rhythm.     Pulses: Normal pulses.     Heart sounds: Normal heart sounds.  Pulmonary:     Effort: Pulmonary effort is normal.     Breath sounds: Normal breath sounds. No wheezing, rhonchi or rales.  Lymphadenopathy:     Head:     Right side of head: No submental, submandibular, tonsillar, preauricular, posterior auricular or occipital adenopathy.     Left side of head: No submental, submandibular, tonsillar, preauricular, posterior auricular or occipital adenopathy.     Cervical: No cervical adenopathy.  Skin:    General: Skin is warm and dry.  Neurological:     Mental Status: She is alert.  Psychiatric:        Speech: Speech normal.        Behavior: Behavior normal.        Thought Content: Thought content normal.

## 2022-09-27 LAB — INSULIN, RANDOM: INSULIN: 9.6 u[IU]/mL (ref 2.6–24.9)

## 2022-11-05 ENCOUNTER — Ambulatory Visit (INDEPENDENT_AMBULATORY_CARE_PROVIDER_SITE_OTHER): Payer: Managed Care, Other (non HMO)

## 2022-11-05 ENCOUNTER — Other Ambulatory Visit: Payer: Self-pay | Admitting: *Deleted

## 2022-11-05 ENCOUNTER — Ambulatory Visit: Payer: Managed Care, Other (non HMO) | Admitting: Podiatry

## 2022-11-05 ENCOUNTER — Encounter: Payer: Self-pay | Admitting: Podiatry

## 2022-11-05 DIAGNOSIS — M778 Other enthesopathies, not elsewhere classified: Secondary | ICD-10-CM

## 2022-11-05 DIAGNOSIS — Z122 Encounter for screening for malignant neoplasm of respiratory organs: Secondary | ICD-10-CM

## 2022-11-05 DIAGNOSIS — Z87891 Personal history of nicotine dependence: Secondary | ICD-10-CM

## 2022-11-05 MED ORDER — TRIAMCINOLONE ACETONIDE 40 MG/ML IJ SUSP
20.0000 mg | Freq: Once | INTRAMUSCULAR | Status: AC
  Administered 2022-11-05: 20 mg

## 2022-11-05 MED ORDER — MELOXICAM 15 MG PO TABS: 15.0000 mg | ORAL_TABLET | Freq: Every day | ORAL | 3 refills | Status: DC

## 2022-11-05 NOTE — Progress Notes (Signed)
Subjective:  Patient ID: Regina Barnes, female    DOB: Jun 06, 1966,  MRN: [id number] HPI Chief Complaint  Patient presents with   Foot Pain    Plantar forefoot left - aching x few months, swollen and getting a callus, worse with a lot of walking, tried scrapping skin-not much help   Diabetes    Last A1c was 7.9   New Patient (Initial Visit)    57 y.o. female presents with the above complaint.   ROS: Denies fever chills nausea vomit muscle aches pains calf pain back pain chest pain shortness of breath.  She states that she will be losing her current insurance in the near future which she is leaving her current job at [employer].  Past Medical History:  Diagnosis Date   Arthritis    Rheumatoid   COVID-19    09/2019, 09/03/20   Diabetes mellitus without complication ([place])    Hyperlipidemia    Hypertension    Tobacco abuse    Past Surgical History:  Procedure Laterality Date   COLONOSCOPY WITH PROPOFOL N/A 12/19/2016   Procedure: COLONOSCOPY WITH PROPOFOL;  Surgeon: Jonathon Bellows, MD;  Location: [facility];  Service: Endoscopy;  Laterality: N/A;    Current Outpatient Medications:    meloxicam (MOBIC) 15 MG tablet, Take 1 tablet (15 mg total) by mouth daily., Disp: 30 tablet, Rfl: 3   augmented betamethasone dipropionate (DIPROLENE-AF) 0.05 % cream, 2 (two) times daily as needed., Disp: , Rfl:    Blood Glucose Monitoring Suppl (CONTOUR NEXT MONITOR) w/Device KIT, 1 Device by Does not apply route daily as needed., Disp: 1 kit, Rfl: 0   cholecalciferol (VITAMIN D) 1000 UNITS tablet, Take 1,000 Units by mouth daily., Disp: , Rfl:    glucose blood (CONTOUR NEXT TEST) test strip, Used to check blood sugars one time a day., Disp: 100 each, Rfl: 4   Lancets MISC, Used to check blood sugars one time a day. DX E11.9., Disp: 100 each, Rfl: 4   metFORMIN (GLUCOPHAGE) 1000 MG tablet, Take 1,000 mg by mouth every evening., Disp: , Rfl:    rosuvastatin (CRESTOR) 10 MG tablet, Take 1 tablet (10 mg  total) by mouth daily., Disp: 90 tablet, Rfl: 3   XELJANZ XR 11 MG TB24, daily. , Disp: , Rfl:   Allergies  Allergen Reactions   Codeine Rash   Review of Systems Objective:  There were no vitals filed for this visit.  General: Well developed, nourished, in no acute distress, alert and oriented x3   Dermatological: Skin is warm, dry and supple bilateral. Nails x 10 are well maintained; remaining integument appears unremarkable at this time. There are no open sores, no preulcerative lesions, no rash or signs of infection present.  Vascular: Dorsalis Pedis artery and Posterior Tibial artery pedal pulses are 2/4 bilateral with immedate capillary fill time. Pedal hair growth present. No varicosities and no lower extremity edema present bilateral.   Neruologic: Grossly intact via light touch bilateral. Vibratory intact via tuning fork bilateral. Protective threshold with Semmes Wienstein monofilament intact to all pedal sites bilateral. Patellar and Achilles deep tendon reflexes 2+ bilateral. No Babinski or clonus noted bilateral.   Musculoskeletal: No gross boney pedal deformities bilateral. No pain, crepitus, or limitation noted with foot and ankle range of motion bilateral. Muscular strength 5/5 in all groups tested bilateral.  She has some tenderness on end range of motion of the second metatarsophalangeal joint of the left foot no hallux valgus deformities noted no hammertoe deformities are noted.  Gait: Unassisted, Nonantalgic.    Radiographs:  Radiographs taken today demonstrate osseously mature foot left foot demonstrates good mineralization with a elongated second metatarsal and slight medial deviation of the second toe.  No acute findings are noted.  Assessment & Plan:   Assessment: Capsulitis with predislocation syndrome second metatarsophalangeal joint left foot.  Plan: Injected around the second metatarsophalangeal joint today started her on Mobic 15 mg 1 p.o. daily and  discussed appropriate shoe gear and I will follow-up with her if this fails to alleviate her symptomatology.  May need to consider orthotics next visit     Corazon Nickolas T. [place], [place]

## 2022-11-11 ENCOUNTER — Ambulatory Visit: Payer: Managed Care, Other (non HMO) | Admitting: Family

## 2022-12-10 ENCOUNTER — Encounter: Payer: Managed Care, Other (non HMO) | Admitting: Acute Care

## 2022-12-11 ENCOUNTER — Ambulatory Visit: Payer: Managed Care, Other (non HMO)

## 2022-12-17 ENCOUNTER — Ambulatory Visit: Payer: Managed Care, Other (non HMO) | Admitting: Family

## 2022-12-17 ENCOUNTER — Encounter: Payer: Self-pay | Admitting: Family

## 2022-12-17 VITALS — BP 128/74 | HR 98 | Temp 98.2°F | Ht 67.0 in | Wt 188.4 lb

## 2022-12-17 DIAGNOSIS — E119 Type 2 diabetes mellitus without complications: Secondary | ICD-10-CM

## 2022-12-17 NOTE — Progress Notes (Unsigned)
Assessment & Plan:  Type 2 diabetes mellitus without complication, without long-term current use of insulin Assessment & Plan: A1c 9.5. Recent prednisone course.  Discussed dietary discretion.  Patient's preference is to continue metformin without dose escalation.  Advised her to monitor glucose so that we can make changes ahead of follow-up if needed.  Continue metformin 1000 mg daily for now      Return precautions given.   Risks, benefits, and alternatives of the medications and treatment plan prescribed today were discussed, and patient expressed understanding.   Education regarding symptom management and diagnosis given to patient on AVS either electronically or printed.  Return in about 3 months (around 03/18/2023).  [name], FNP  Subjective:    Patient ID: Regina Barnes, female    DOB: [birth year], 57 y.o.   MRN: [id number]  CC: EDIT Regina Barnes is a 57 y.o. female who presents today for follow up.   HPI: Here to discuss elevation in A1c. She endorses dietary indiscretion and stress at work.  She retired from [employer] this week.  She plans to focus more on her health and lowering her blood sugar   A1c 9.5 She has been on 6 day taper for prednisone 2 weeks ago for sciatica.   She was seen by Dr. Allena Katz, rheumatology 12/15/2022.  Continued on Xeljanz. Allergies: Codeine Current Outpatient Medications on File Prior to Visit  Medication Sig Dispense Refill   augmented betamethasone dipropionate (DIPROLENE-AF) 0.05 % cream 2 (two) times daily as needed.     Blood Glucose Monitoring Suppl (CONTOUR NEXT MONITOR) w/Device KIT 1 Device by Does not apply route daily as needed. 1 kit 0   cholecalciferol (VITAMIN D) 1000 UNITS tablet Take 1,000 Units by mouth daily.     glucose blood (CONTOUR NEXT TEST) test strip Used to check blood sugars one time a day. 100 each 4   Lancets MISC Used to check blood sugars one time a day. DX E11.9. 100 each 4   metFORMIN (GLUCOPHAGE) 1000 MG  tablet Take 1,000 mg by mouth every evening.     rosuvastatin (CRESTOR) 10 MG tablet Take 1 tablet (10 mg total) by mouth daily. 90 tablet 3   XELJANZ XR 11 MG TB24 daily.      No current facility-administered medications on file prior to visit.    Review of Systems  Constitutional:  Negative for chills and fever.  Respiratory:  Negative for cough.   Cardiovascular:  Negative for chest pain and palpitations.  Gastrointestinal:  Negative for nausea and vomiting.      Objective:    BP 128/74   Pulse 98   Temp 98.2 F (36.8 C) (Oral)   Ht  (1.702 m)   Wt 188 lb 6.4 oz (85.5 kg)   SpO2 95%   BMI 29.51 kg/m  BP Readings from Last 3 Encounters:  12/17/22 128/74  09/26/22 128/76  08/12/22 118/88   Wt Readings from Last 3 Encounters:  12/17/22 188 lb 6.4 oz (85.5 kg)  09/26/22 185 lb 6.4 oz (84.1 kg)  08/12/22 187 lb 3.2 oz (84.9 kg)    Physical Exam Vitals reviewed.  Constitutional:      Appearance: She is well-developed.  Eyes:     Conjunctiva/sclera: Conjunctivae normal.  Cardiovascular:     Rate and Rhythm: Normal rate and regular rhythm.     Pulses: Normal pulses.     Heart sounds: Normal heart sounds.  Pulmonary:     Effort: Pulmonary effort is normal.  Breath sounds: Normal breath sounds. No wheezing, rhonchi or rales.  Skin:    General: Skin is warm and dry.  Neurological:     Mental Status: She is alert.  Psychiatric:        Speech: Speech normal.        Behavior: Behavior normal.        Thought Content: Thought content normal.

## 2022-12-17 NOTE — Patient Instructions (Signed)
Goal of fasting blood sugar is between 70-120. If in this range, we are reaching our target a1c ( goal 6.5%)   Please check fasting blood sugar in the morning time once or twice a week.  You may also check if you feel like you are having a low episode or particularly high episode of blood sugar.  If blood sugars increase, I may advise you to check blood sugar after your largest meal.  You specifically do this TWO hours after largest meal with the goal of being less than 180.  If blood sugar is checked sooner than 2 hours after largest meal, and it will be  expected to be elevated. You must wait 2 hours.   If your blood sugar is less than 180 hours after your largest meal, again we are reaching our target a1c goal   Call [facility] if: BG < 70 or > 300.   If you have any symptoms of low blood sugar ( sweating, shakiness, lightheaded, dizzy) that you notify me. If you have a low, please drink a glass of orange juice and recheck blood sugar every 5 minutes until you don't feel symptomatic AND blood sugar is above 80.     

## 2022-12-18 NOTE — Assessment & Plan Note (Signed)
A1c 9.5. Recent prednisone course.  Discussed dietary discretion.  Patient's preference is to continue metformin without dose escalation.  Advised her to monitor glucose so that we can make changes ahead of follow-up if needed.  Continue metformin 1000 mg daily for now

## 2022-12-24 DIAGNOSIS — M9905 Segmental and somatic dysfunction of pelvic region: Secondary | ICD-10-CM | POA: Diagnosis not present

## 2022-12-24 DIAGNOSIS — M9903 Segmental and somatic dysfunction of lumbar region: Secondary | ICD-10-CM | POA: Diagnosis not present

## 2022-12-24 DIAGNOSIS — M5417 Radiculopathy, lumbosacral region: Secondary | ICD-10-CM | POA: Diagnosis not present

## 2022-12-24 DIAGNOSIS — M5431 Sciatica, right side: Secondary | ICD-10-CM | POA: Diagnosis not present

## 2022-12-26 DIAGNOSIS — M9905 Segmental and somatic dysfunction of pelvic region: Secondary | ICD-10-CM | POA: Diagnosis not present

## 2022-12-26 DIAGNOSIS — M9903 Segmental and somatic dysfunction of lumbar region: Secondary | ICD-10-CM | POA: Diagnosis not present

## 2022-12-26 DIAGNOSIS — M5431 Sciatica, right side: Secondary | ICD-10-CM | POA: Diagnosis not present

## 2022-12-26 DIAGNOSIS — M5417 Radiculopathy, lumbosacral region: Secondary | ICD-10-CM | POA: Diagnosis not present

## 2022-12-29 DIAGNOSIS — M9903 Segmental and somatic dysfunction of lumbar region: Secondary | ICD-10-CM | POA: Diagnosis not present

## 2022-12-29 DIAGNOSIS — M5417 Radiculopathy, lumbosacral region: Secondary | ICD-10-CM | POA: Diagnosis not present

## 2022-12-29 DIAGNOSIS — M5431 Sciatica, right side: Secondary | ICD-10-CM | POA: Diagnosis not present

## 2022-12-29 DIAGNOSIS — M9905 Segmental and somatic dysfunction of pelvic region: Secondary | ICD-10-CM | POA: Diagnosis not present

## 2022-12-31 DIAGNOSIS — M5431 Sciatica, right side: Secondary | ICD-10-CM | POA: Diagnosis not present

## 2022-12-31 DIAGNOSIS — M9905 Segmental and somatic dysfunction of pelvic region: Secondary | ICD-10-CM | POA: Diagnosis not present

## 2022-12-31 DIAGNOSIS — M9903 Segmental and somatic dysfunction of lumbar region: Secondary | ICD-10-CM | POA: Diagnosis not present

## 2022-12-31 DIAGNOSIS — M5417 Radiculopathy, lumbosacral region: Secondary | ICD-10-CM | POA: Diagnosis not present

## 2023-01-05 DIAGNOSIS — M9905 Segmental and somatic dysfunction of pelvic region: Secondary | ICD-10-CM | POA: Diagnosis not present

## 2023-01-05 DIAGNOSIS — M5431 Sciatica, right side: Secondary | ICD-10-CM | POA: Diagnosis not present

## 2023-01-05 DIAGNOSIS — M9903 Segmental and somatic dysfunction of lumbar region: Secondary | ICD-10-CM | POA: Diagnosis not present

## 2023-01-05 DIAGNOSIS — M5417 Radiculopathy, lumbosacral region: Secondary | ICD-10-CM | POA: Diagnosis not present

## 2023-01-07 DIAGNOSIS — M5417 Radiculopathy, lumbosacral region: Secondary | ICD-10-CM | POA: Diagnosis not present

## 2023-01-07 DIAGNOSIS — M5431 Sciatica, right side: Secondary | ICD-10-CM | POA: Diagnosis not present

## 2023-01-07 DIAGNOSIS — M9903 Segmental and somatic dysfunction of lumbar region: Secondary | ICD-10-CM | POA: Diagnosis not present

## 2023-01-07 DIAGNOSIS — M9905 Segmental and somatic dysfunction of pelvic region: Secondary | ICD-10-CM | POA: Diagnosis not present

## 2023-01-12 DIAGNOSIS — M5431 Sciatica, right side: Secondary | ICD-10-CM | POA: Diagnosis not present

## 2023-01-12 DIAGNOSIS — M5417 Radiculopathy, lumbosacral region: Secondary | ICD-10-CM | POA: Diagnosis not present

## 2023-01-12 DIAGNOSIS — M9905 Segmental and somatic dysfunction of pelvic region: Secondary | ICD-10-CM | POA: Diagnosis not present

## 2023-01-12 DIAGNOSIS — M9903 Segmental and somatic dysfunction of lumbar region: Secondary | ICD-10-CM | POA: Diagnosis not present

## 2023-01-14 DIAGNOSIS — M5417 Radiculopathy, lumbosacral region: Secondary | ICD-10-CM | POA: Diagnosis not present

## 2023-01-14 DIAGNOSIS — M9903 Segmental and somatic dysfunction of lumbar region: Secondary | ICD-10-CM | POA: Diagnosis not present

## 2023-01-14 DIAGNOSIS — M5431 Sciatica, right side: Secondary | ICD-10-CM | POA: Diagnosis not present

## 2023-01-14 DIAGNOSIS — M9905 Segmental and somatic dysfunction of pelvic region: Secondary | ICD-10-CM | POA: Diagnosis not present

## 2023-01-15 DIAGNOSIS — M5441 Lumbago with sciatica, right side: Secondary | ICD-10-CM | POA: Diagnosis not present

## 2023-01-22 DIAGNOSIS — M5441 Lumbago with sciatica, right side: Secondary | ICD-10-CM | POA: Diagnosis not present

## 2023-01-23 DIAGNOSIS — M5416 Radiculopathy, lumbar region: Secondary | ICD-10-CM | POA: Diagnosis not present

## 2023-01-30 ENCOUNTER — Ambulatory Visit: Payer: Managed Care, Other (non HMO) | Admitting: Internal Medicine

## 2023-02-11 DIAGNOSIS — M5416 Radiculopathy, lumbar region: Secondary | ICD-10-CM | POA: Diagnosis not present

## 2023-02-11 DIAGNOSIS — E119 Type 2 diabetes mellitus without complications: Secondary | ICD-10-CM | POA: Diagnosis not present

## 2023-02-27 DIAGNOSIS — M5416 Radiculopathy, lumbar region: Secondary | ICD-10-CM | POA: Diagnosis not present

## 2023-03-18 DIAGNOSIS — M5416 Radiculopathy, lumbar region: Secondary | ICD-10-CM | POA: Diagnosis not present

## 2023-03-31 DIAGNOSIS — L400 Psoriasis vulgaris: Secondary | ICD-10-CM | POA: Diagnosis not present

## 2023-03-31 DIAGNOSIS — D225 Melanocytic nevi of trunk: Secondary | ICD-10-CM | POA: Diagnosis not present

## 2023-03-31 DIAGNOSIS — L821 Other seborrheic keratosis: Secondary | ICD-10-CM | POA: Diagnosis not present

## 2023-04-03 DIAGNOSIS — M5416 Radiculopathy, lumbar region: Secondary | ICD-10-CM | POA: Diagnosis not present

## 2023-04-09 ENCOUNTER — Encounter (INDEPENDENT_AMBULATORY_CARE_PROVIDER_SITE_OTHER): Payer: Self-pay

## 2023-04-09 DIAGNOSIS — M5416 Radiculopathy, lumbar region: Secondary | ICD-10-CM | POA: Diagnosis not present

## 2023-04-13 ENCOUNTER — Ambulatory Visit: Payer: Managed Care, Other (non HMO) | Admitting: Family

## 2023-04-14 LAB — HM DIABETES EYE EXAM

## 2023-04-16 DIAGNOSIS — L409 Psoriasis, unspecified: Secondary | ICD-10-CM | POA: Diagnosis not present

## 2023-04-16 DIAGNOSIS — Z7960 Long term (current) use of unspecified immunomodulators and immunosuppressants: Secondary | ICD-10-CM | POA: Diagnosis not present

## 2023-04-16 DIAGNOSIS — L4050 Arthropathic psoriasis, unspecified: Secondary | ICD-10-CM | POA: Diagnosis not present

## 2023-04-20 ENCOUNTER — Ambulatory Visit: Payer: Managed Care, Other (non HMO) | Admitting: Family

## 2023-05-08 ENCOUNTER — Ambulatory Visit: Payer: 59 | Admitting: Family

## 2023-05-08 ENCOUNTER — Encounter: Payer: Self-pay | Admitting: Family

## 2023-05-08 VITALS — BP 126/76 | HR 78 | Temp 98.1°F | Ht 67.0 in | Wt 186.2 lb

## 2023-05-08 DIAGNOSIS — M5441 Lumbago with sciatica, right side: Secondary | ICD-10-CM | POA: Diagnosis not present

## 2023-05-08 DIAGNOSIS — E119 Type 2 diabetes mellitus without complications: Secondary | ICD-10-CM

## 2023-05-08 DIAGNOSIS — M545 Low back pain, unspecified: Secondary | ICD-10-CM | POA: Insufficient documentation

## 2023-05-08 DIAGNOSIS — G8929 Other chronic pain: Secondary | ICD-10-CM | POA: Diagnosis not present

## 2023-05-08 DIAGNOSIS — Z7984 Long term (current) use of oral hypoglycemic drugs: Secondary | ICD-10-CM | POA: Diagnosis not present

## 2023-05-08 LAB — LIPID PANEL
Cholesterol: 250 mg/dL — ABNORMAL HIGH (ref 0–200)
HDL: 74.9 mg/dL (ref >39.00)
LDL Cholesterol: 156 mg/dL — ABNORMAL HIGH (ref 0–99)
NonHDL: 175.33
Total CHOL/HDL Ratio: 3
Triglycerides: 99 mg/dL (ref 0.0–149.0)
VLDL: 19.8 mg/dL (ref 0.0–40.0)

## 2023-05-08 LAB — HEMOGLOBIN A1C: Hgb A1c MFr Bld: 9.3 % — ABNORMAL HIGH (ref 4.6–6.5)

## 2023-05-08 MED ORDER — GABAPENTIN 300 MG PO CAPS: 300.0000 mg | ORAL_CAPSULE | Freq: Two times a day (BID) | ORAL | 3 refills | Status: DC

## 2023-05-08 MED ORDER — METFORMIN HCL 1000 MG PO TABS: 1000.0000 mg | ORAL_TABLET | Freq: Two times a day (BID) | ORAL | 3 refills | Status: DC

## 2023-05-08 MED ORDER — CONTOUR NEXT TEST VI STRP: ORAL_STRIP | 4 refills | Status: AC

## 2023-05-08 NOTE — Patient Instructions (Addendum)
Resume gabapentin by taking 300mg  twice daily.  consider second surgery opinion   [facility]  Dr Lovell Sheehan and Dr Tonna Boehringer neurosurgeon Dr Marcell Barlow    Non surgical consults-  Consider psychiatry, Dr [facility]  Consider pain management/ anesthesiology, Dr Edward Jolly

## 2023-05-08 NOTE — Progress Notes (Unsigned)
Assessment & Plan:  Type 2 diabetes mellitus without complication, without long-term current use of insulin (HCC) Assessment & Plan: Lab Results  Component Value Date   HGBA1C 9.3 (H) 05/08/2023   Uncontrolled.  Patient has had steroid injections which we agreed her likely raising blood sugar as well.  Continue metformin 1000 mg twice daily.  Previously discussed GLP-1 agonist.  I will send patient a MyChart message in this regard.   Orders: -     Contour Next Test; Used to check blood sugars one time a day.  Dispense: 100 each; Refill: 4 -     metFORMIN HCl; Take 1 tablet (1,000 mg total) by mouth 2 (two) times daily with a meal.  Dispense: 180 tablet; Refill: 3 -     Lipid panel -     Insulin, random -     Hemoglobin A1c  Chronic right-sided low back pain with right-sided sciatica Assessment & Plan: Poor control, affecting quality of life.  We discussed resumption of gabapentin 300 mg twice daily.  Consider lyrica. Counseled on sedating properties of medication.  She is considering a second opinion in regards to surgery.  I also provided her with names of pain management providers as well.  She will let me know how I can support her  Orders: -     Gabapentin; Take 1 capsule (300 mg total) by mouth 2 (two) times daily.  Dispense: 180 capsule; Refill: 3     Return precautions given.   Risks, benefits, and alternatives of the medications and treatment plan prescribed today were discussed, and patient expressed understanding.   Education regarding symptom management and diagnosis given to patient on AVS either electronically or printed.  No follow-ups on file.  Regina Plowman, FNP  Subjective:    Patient ID: Regina Barnes, female    DOB: [birth year], 57 y.o.   MRN: [id number]  CC: Regina Barnes is a 57 y.o. female who presents today for follow up.   HPI: Continues to have right sided low back pain Pain radiated to right foot.  Pain is worse after long periods of sitting;  pain 'pretty much disappears' when standing/ walking.  No saddle anesthesia, groin pain, urinary or bowl incontinence.  She had taken gabapentin 300mg  BID and she has been taking once per day for the past couple of weeks.   Tried mobic and flexeril without relief.    She doesn't feel excessively sedated on gabapentin .   Seen by [facility] 04/03/2023, Dr Noralyn Pick .  Leg pain had resolved.  Physical therapy ordered.  Refilled gabapentin.  Discussed surgery which would be L4-L5 and L5-S1 right-sided decompression. MRI lumbar obtained by [facility]. Leg pain had resolved at that time. Status post epidural injection 03/18/2023  History of psoriatic arthritis, recent follow-up Dr. Allena Katz 04/16/2023  Allergies: Codeine Current Outpatient Medications on File Prior to Visit  Medication Sig Dispense Refill   augmented betamethasone dipropionate (DIPROLENE-AF) 0.05 % cream 2 (two) times daily as needed.     Blood Glucose Monitoring Suppl (CONTOUR NEXT MONITOR) w/Device KIT 1 Device by Does not apply route daily as needed. 1 kit 0   cholecalciferol (VITAMIN D) 1000 UNITS tablet Take 1,000 Units by mouth daily.     Lancets MISC Used to check blood sugars one time a day. DX E11.9. 100 each 4   rosuvastatin (CRESTOR) 10 MG tablet Take 1 tablet (10 mg total) by mouth daily. 90 tablet 3   XELJANZ XR 11 MG TB24 daily.  No current facility-administered medications on file prior to visit.    Review of Systems  Constitutional:  Negative for chills and fever.  Respiratory:  Negative for cough.   Cardiovascular:  Negative for chest pain and palpitations.  Gastrointestinal:  Negative for nausea and vomiting.  Genitourinary:  Negative for difficulty urinating.  Musculoskeletal:  Positive for back pain.  Neurological:  Positive for numbness.      Objective:    BP 126/76   Pulse 78   Temp 98.1 F (36.7 C) (Oral)   Ht 5\' 7"  (1.702 m)   Wt 186 lb 3.2 oz (84.5 kg)   SpO2 98%   BMI 29.16 kg/m  BP  Readings from Last 3 Encounters:  05/08/23 126/76  12/17/22 128/74  09/26/22 128/76   Wt Readings from Last 3 Encounters:  05/08/23 186 lb 3.2 oz (84.5 kg)  12/17/22 188 lb 6.4 oz (85.5 kg)  09/26/22 185 lb 6.4 oz (84.1 kg)    Physical Exam Vitals reviewed.  Constitutional:      Appearance: She is well-developed.  Eyes:     Conjunctiva/sclera: Conjunctivae normal.  Cardiovascular:     Rate and Rhythm: Normal rate and regular rhythm.     Pulses: Normal pulses.     Heart sounds: Normal heart sounds.  Pulmonary:     Effort: Pulmonary effort is normal.     Breath sounds: Normal breath sounds. No wheezing, rhonchi or rales.  Musculoskeletal:     Lumbar back: No swelling, edema, spasms, tenderness or bony tenderness. Normal range of motion. Positive right straight leg raise test. Negative left straight leg raise test.       Back:     Comments: Full range of motion with flexion, tension, lateral side bends. No bony tenderness.   Skin:    General: Skin is warm and dry.  Neurological:     Mental Status: She is alert.     Sensory: No sensory deficit.     Deep Tendon Reflexes:     Reflex Scores:      Patellar reflexes are 2+ on the right side and 2+ on the left side.    Comments: Sensation and strength intact bilateral lower extremities.  Psychiatric:        Speech: Speech normal.        Behavior: Behavior normal.        Thought Content: Thought content normal.

## 2023-05-09 LAB — INSULIN, RANDOM: Insulin: 12.3 u[IU]/mL

## 2023-05-11 ENCOUNTER — Encounter: Payer: Self-pay | Admitting: Family

## 2023-05-11 NOTE — Assessment & Plan Note (Addendum)
Lab Results  Component Value Date   HGBA1C 9.3 (H) 05/08/2023   Uncontrolled.  Patient has had steroid injections which we agreed her likely raising blood sugar as well.  Continue metformin 1000 mg twice daily.  Previously discussed GLP-1 agonist.  I will send patient a MyChart message in this regard.

## 2023-05-11 NOTE — Assessment & Plan Note (Signed)
Poor control, affecting quality of life.  We discussed resumption of gabapentin 300 mg twice daily.  Consider lyrica. Counseled on sedating properties of medication.  She is considering a second opinion in regards to surgery.  I also provided her with names of pain management providers as well.  She will let me know how I can support her

## 2023-06-22 ENCOUNTER — Other Ambulatory Visit: Payer: Self-pay | Admitting: Family Medicine

## 2023-06-22 ENCOUNTER — Inpatient Hospital Stay
Admission: RE | Admit: 2023-06-22 | Discharge: 2023-06-22 | Disposition: A | Discharge status: Home / Self Care | Payer: Self-pay | Source: Ambulatory Visit | Attending: Neurosurgery | Admitting: Neurosurgery

## 2023-06-22 DIAGNOSIS — Z049 Encounter for examination and observation for unspecified reason: Secondary | ICD-10-CM

## 2023-06-22 DIAGNOSIS — M5416 Radiculopathy, lumbar region: Secondary | ICD-10-CM | POA: Diagnosis not present

## 2023-06-22 DIAGNOSIS — M48062 Spinal stenosis, lumbar region with neurogenic claudication: Secondary | ICD-10-CM | POA: Diagnosis not present

## 2023-06-22 DIAGNOSIS — M5126 Other intervertebral disc displacement, lumbar region: Secondary | ICD-10-CM | POA: Diagnosis not present

## 2023-07-13 ENCOUNTER — Other Ambulatory Visit: Payer: Self-pay | Admitting: Family

## 2023-07-13 DIAGNOSIS — Z1231 Encounter for screening mammogram for malignant neoplasm of breast: Secondary | ICD-10-CM

## 2023-07-13 DIAGNOSIS — M5441 Lumbago with sciatica, right side: Secondary | ICD-10-CM | POA: Diagnosis not present

## 2023-07-14 DIAGNOSIS — L4050 Arthropathic psoriasis, unspecified: Secondary | ICD-10-CM | POA: Diagnosis not present

## 2023-07-16 DIAGNOSIS — M5441 Lumbago with sciatica, right side: Secondary | ICD-10-CM | POA: Diagnosis not present

## 2023-07-20 DIAGNOSIS — M5441 Lumbago with sciatica, right side: Secondary | ICD-10-CM | POA: Diagnosis not present

## 2023-07-27 DIAGNOSIS — M5441 Lumbago with sciatica, right side: Secondary | ICD-10-CM | POA: Diagnosis not present

## 2023-07-29 DIAGNOSIS — M5441 Lumbago with sciatica, right side: Secondary | ICD-10-CM | POA: Diagnosis not present

## 2023-08-06 DIAGNOSIS — M48062 Spinal stenosis, lumbar region with neurogenic claudication: Secondary | ICD-10-CM | POA: Diagnosis not present

## 2023-08-06 DIAGNOSIS — M5126 Other intervertebral disc displacement, lumbar region: Secondary | ICD-10-CM | POA: Diagnosis not present

## 2023-08-06 DIAGNOSIS — M5416 Radiculopathy, lumbar region: Secondary | ICD-10-CM | POA: Diagnosis not present

## 2023-08-07 DIAGNOSIS — M5126 Other intervertebral disc displacement, lumbar region: Secondary | ICD-10-CM | POA: Diagnosis not present

## 2023-08-07 DIAGNOSIS — E119 Type 2 diabetes mellitus without complications: Secondary | ICD-10-CM | POA: Diagnosis not present

## 2023-08-07 DIAGNOSIS — M5416 Radiculopathy, lumbar region: Secondary | ICD-10-CM | POA: Diagnosis not present

## 2023-08-21 NOTE — Progress Notes (Signed)
 Referring Physician:  Avanell Katz, MD [address] [place],  [place] [address]  Primary Physician:  Dineen Rollene MATSU, FNP  History of Present Illness: 08/25/2023 [name] is here today with a chief complaint of right lower extremity pain.  She has been having pain in her low back that extends into her right buttock, down her leg to her right ankle.  This been ongoing for 10 months.  She began having the pain in February of this year.  Bending, lifting, standing, walking make it worse.  Sitting in a recliner helps.  Her pain is approximately 5 out of 10.  She did have an injection approximately 2 weeks ago which has helped.  Bowel/Bladder Dysfunction: none  Conservative measures: 10 chiropractic visits Physical therapy:  has participated in at [facility] from 07/13/23 to 07/29/23 Multimodal medical therapy including regular antiinflammatories:  medrol  dosepak, meloxicam , robaxin , gabapentin   Injections:  08/07/2023: Right L5-S1 and right S1 transforaminal ESI (dexamethasone  7 mg)  03/18/2023: L4-5 interlaminar ESI (moderate to good relief, Kenalog  80 mg) 02/11/2023: Right L4-5 interlaminar ESI (85%, Kenalog  40 mg)   Past Surgery: no prior spinal surgeries   Regina Barnes has no symptoms of cervical myelopathy.  The symptoms are causing a significant impact on the patient's life.   I have utilized the care everywhere function in epic to review the outside records available from external health systems.  Review of Systems:  A 10 point review of systems is negative, except for the pertinent positives and negatives detailed in the HPI.  Past Medical History: Past Medical History:  Diagnosis Date   Arthritis    Rheumatoid   COVID-19    09/2019, 09/03/20   Diabetes mellitus without complication (HCC)    Hyperlipidemia    Hypertension    Tobacco abuse     Past Surgical History: Past Surgical History:  Procedure Laterality Date   COLONOSCOPY WITH  PROPOFOL  N/A 12/19/2016   Procedure: COLONOSCOPY WITH PROPOFOL ;  Surgeon: Ruel Kung, MD;  Location: [facility] ENDOSCOPY;  Service: Endoscopy;  Laterality: N/A;    Allergies: Allergies as of 08/25/2023 - Review Complete 08/25/2023  Allergen Reaction Noted   Codeine Rash 12/01/2012    Medications:  Current Outpatient Medications:    Blood Glucose Monitoring Suppl (CONTOUR NEXT MONITOR) w/Device KIT, 1 Device by Does not apply route daily as needed., Disp: 1 kit, Rfl: 0   cholecalciferol (VITAMIN D ) 1000 UNITS tablet, Take 1,000 Units by mouth daily., Disp: , Rfl:    gabapentin  (NEURONTIN ) 300 MG capsule, Take 1 capsule (300 mg total) by mouth 2 (two) times daily., Disp: 180 capsule, Rfl: 3   glucose blood (CONTOUR NEXT TEST) test strip, Used to check blood sugars one time a day., Disp: 100 each, Rfl: 4   ibuprofen (ADVIL) 200 MG tablet, 1 tablet as needed Orally every 6 hrs, Disp: , Rfl:    Lancets MISC, Used to check blood sugars one time a day. DX E11.9., Disp: 100 each, Rfl: 4   metFORMIN  (GLUCOPHAGE ) 1000 MG tablet, Take 1 tablet (1,000 mg total) by mouth 2 (two) times daily with a meal., Disp: 180 tablet, Rfl: 3   rosuvastatin  (CRESTOR ) 10 MG tablet, Take 1 tablet (10 mg total) by mouth daily., Disp: 90 tablet, Rfl: 3   traMADol (ULTRAM) 50 MG tablet, Take 25-50 mg by mouth 2 (two) times daily as needed., Disp: , Rfl:    XELJANZ XR 11 MG TB24, daily. , Disp: , Rfl:  Social History: Social History   Tobacco Use   Smoking status: Former    Current packs/day: 0.00    Average packs/day: 0.3 packs/day for 20.0 years (6.0 ttl pk-yrs)    Types: Cigarettes    Start date: 12/23/2000    Quit date: 12/23/2020    Years since quitting: 2.6   Smokeless tobacco: Never  Vaping Use   Vaping status: Former   Devices: tried once  Substance Use Topics   Alcohol use: Yes    Alcohol/week: 4.0 standard drinks of alcohol    Types: 4 Cans of beer per week    Comment: weekly   Drug use: No    Family  Medical History: Family History  Problem Relation Age of Onset   Heart attack Father 52   Diabetes Other    Breast cancer Maternal Aunt        great aunt >50   Breast cancer Paternal Aunt        great aunt   Hyperlipidemia Mother    Kidney disease Mother    Thyroid cancer Neg Hx     Physical Examination: Vitals:   08/25/23 0942  BP: (!) 158/102    General: Patient is in no apparent distress. Attention to examination is appropriate.  Neck:   Supple.  Full range of motion.  Respiratory: Patient is breathing without any difficulty.   NEUROLOGICAL:     Awake, alert, oriented to person, place, and time.  Speech is clear and fluent.   Cranial Nerves: Pupils equal round and reactive to light.  Facial tone is symmetric.  Facial sensation is symmetric. Shoulder shrug is symmetric. Tongue protrusion is midline.  There is no pronator drift.  Strength: Side Biceps Triceps Deltoid Interossei Grip Wrist Ext. Wrist Flex.  R 5 5 5 5 5 5 5   L 5 5 5 5 5 5 5    Side Iliopsoas Quads Hamstring PF DF EHL  R 5 5 5 5 5 5   L 5 5 5 5 5 5    Reflexes are 1+ and symmetric at the biceps, triceps, brachioradialis, patella and achilles.   Hoffman's is absent.   Bilateral upper and lower extremity sensation is intact to light touch.    No evidence of dysmetria noted.  Gait is antalgic.   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  L5-S1 central disc herniation with abutment of the bilateral descending S1 nerve roots.   I have personally reviewed the images and agree with the above interpretation.  Assessment and Plan: Regina Barnes is a pleasant 57 y.o. 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  She also has possible right S1 involvement.   Her MRI scan is almost 59 months old.  I would like to repeat her MRI scan.  At this point, she has maximized conservative management.  She has had symptoms for 10 months, pursued physical therapy, and considered medications and injections.  At this point, I think an lumbar discectomy is most appropriate.  I will finalize my surgical plan based on her MRI scan.  I have recommended that she return to physical therapy until she is ready for surgery.  We will check her A1c.  She will need to have her sugar management optimized prior to surgical intervention.  She expressed understanding.  I spent a total of 30 minutes in this patient's care today. This time was spent reviewing pertinent records including imaging studies, obtaining and confirming history, performing a directed evaluation, formulating and discussing my recommendations, and documenting the visit within the medical record.     Thank you for involving me in the care of this patient.      Sinthia Karabin K. Clois MD, [facility]

## 2023-08-25 ENCOUNTER — Ambulatory Visit (INDEPENDENT_AMBULATORY_CARE_PROVIDER_SITE_OTHER): Payer: 59 | Admitting: Neurosurgery

## 2023-08-25 ENCOUNTER — Encounter: Payer: Self-pay | Admitting: Neurosurgery

## 2023-08-25 ENCOUNTER — Other Ambulatory Visit
Admission: RE | Admit: 2023-08-25 | Discharge: 2023-08-25 | Disposition: A | Discharge status: Home / Self Care | Payer: 59 | Source: Ambulatory Visit | Attending: Family | Admitting: Family

## 2023-08-25 ENCOUNTER — Ambulatory Visit
Admission: RE | Admit: 2023-08-25 | Discharge: 2023-08-25 | Disposition: A | Discharge status: Home / Self Care | Payer: 59 | Source: Ambulatory Visit | Attending: Family | Admitting: Family

## 2023-08-25 VITALS — BP 140/98 | Ht 67.0 in | Wt 183.0 lb

## 2023-08-25 DIAGNOSIS — Z1231 Encounter for screening mammogram for malignant neoplasm of breast: Secondary | ICD-10-CM | POA: Insufficient documentation

## 2023-08-25 DIAGNOSIS — E119 Type 2 diabetes mellitus without complications: Secondary | ICD-10-CM | POA: Diagnosis not present

## 2023-08-25 DIAGNOSIS — M5416 Radiculopathy, lumbar region: Secondary | ICD-10-CM

## 2023-08-25 DIAGNOSIS — M5116 Intervertebral disc disorders with radiculopathy, lumbar region: Secondary | ICD-10-CM | POA: Diagnosis not present

## 2023-08-25 LAB — HEMOGLOBIN A1C
Hgb A1c MFr Bld: 8.5 % — ABNORMAL HIGH (ref 4.8–5.6)
Mean Plasma Glucose: 197 mg/dL

## 2023-08-27 ENCOUNTER — Telehealth: Payer: Self-pay

## 2023-08-27 NOTE — Telephone Encounter (Signed)
-----   Message from [facility] sent at 08/27/2023  1:21 PM EST ----- If she is really diligent about her sugar control, we can do a fructosamine test in 3 weeks and then schedule her ----- Message ----- From: Interface, Lab In [place] Sent: 08/25/2023  11:36 PM EST To: Reeves Daisy, MD

## 2023-08-28 ENCOUNTER — Telehealth: Payer: Self-pay

## 2023-08-28 DIAGNOSIS — E119 Type 2 diabetes mellitus without complications: Secondary | ICD-10-CM

## 2023-08-28 NOTE — Telephone Encounter (Addendum)
 Patient returned call from yesterday regarding Dr. Elliot request for fructosamine test. I explained to the patient that she would need to have 3 weeks of good control of her blood glucose prior to getting this test. I asked her if she would like us  to put in the order ahead of time or if she would like to contact us  in the future when she is ready to test. She asked us  to go ahead and put the order. At the end of our call, she stated that Dr. Clois had given her the ICD 10 code for her primary diagnosis with us . I confirmed this code and sent a my chart message with the code.

## 2023-08-28 NOTE — Addendum Note (Signed)
 Addended by: Barbette Hair on: 08/28/2023 10:32 AM   Modules accepted: Orders

## 2023-08-31 DIAGNOSIS — M5416 Radiculopathy, lumbar region: Secondary | ICD-10-CM | POA: Diagnosis not present

## 2023-08-31 DIAGNOSIS — M48062 Spinal stenosis, lumbar region with neurogenic claudication: Secondary | ICD-10-CM | POA: Diagnosis not present

## 2023-08-31 DIAGNOSIS — M5126 Other intervertebral disc displacement, lumbar region: Secondary | ICD-10-CM | POA: Diagnosis not present

## 2023-08-31 DIAGNOSIS — M5441 Lumbago with sciatica, right side: Secondary | ICD-10-CM | POA: Diagnosis not present

## 2023-09-09 DIAGNOSIS — M5441 Lumbago with sciatica, right side: Secondary | ICD-10-CM | POA: Diagnosis not present

## 2023-09-10 DIAGNOSIS — E1165 Type 2 diabetes mellitus with hyperglycemia: Secondary | ICD-10-CM | POA: Diagnosis not present

## 2023-09-10 DIAGNOSIS — L409 Psoriasis, unspecified: Secondary | ICD-10-CM | POA: Diagnosis not present

## 2023-09-10 DIAGNOSIS — Z7960 Long term (current) use of unspecified immunomodulators and immunosuppressants: Secondary | ICD-10-CM | POA: Diagnosis not present

## 2023-09-10 DIAGNOSIS — E1169 Type 2 diabetes mellitus with other specified complication: Secondary | ICD-10-CM | POA: Diagnosis not present

## 2023-09-10 DIAGNOSIS — L4050 Arthropathic psoriasis, unspecified: Secondary | ICD-10-CM | POA: Diagnosis not present

## 2023-09-10 DIAGNOSIS — E785 Hyperlipidemia, unspecified: Secondary | ICD-10-CM | POA: Diagnosis not present

## 2023-09-15 ENCOUNTER — Ambulatory Visit
Admission: RE | Admit: 2023-09-15 | Discharge: 2023-09-15 | Disposition: A | Discharge status: Home / Self Care | Payer: 59 | Source: Ambulatory Visit | Attending: Neurosurgery | Admitting: Neurosurgery

## 2023-09-15 DIAGNOSIS — M5126 Other intervertebral disc displacement, lumbar region: Secondary | ICD-10-CM | POA: Diagnosis not present

## 2023-09-15 DIAGNOSIS — M5416 Radiculopathy, lumbar region: Secondary | ICD-10-CM

## 2023-09-16 DIAGNOSIS — M5441 Lumbago with sciatica, right side: Secondary | ICD-10-CM | POA: Diagnosis not present

## 2023-09-24 ENCOUNTER — Other Ambulatory Visit
Admission: RE | Admit: 2023-09-24 | Discharge: 2023-09-24 | Disposition: A | Discharge status: Home / Self Care | Payer: 59 | Source: Ambulatory Visit | Attending: Neurosurgery | Admitting: Neurosurgery

## 2023-09-24 DIAGNOSIS — E119 Type 2 diabetes mellitus without complications: Secondary | ICD-10-CM | POA: Insufficient documentation

## 2023-09-24 DIAGNOSIS — E1169 Type 2 diabetes mellitus with other specified complication: Secondary | ICD-10-CM | POA: Diagnosis not present

## 2023-09-24 DIAGNOSIS — E1165 Type 2 diabetes mellitus with hyperglycemia: Secondary | ICD-10-CM | POA: Diagnosis not present

## 2023-09-24 DIAGNOSIS — E785 Hyperlipidemia, unspecified: Secondary | ICD-10-CM | POA: Diagnosis not present

## 2023-09-25 ENCOUNTER — Encounter: Payer: Self-pay | Admitting: Emergency Medicine

## 2023-09-25 LAB — FRUCTOSAMINE: Fructosamine: 296 umol/L — ABNORMAL HIGH (ref 0–285)

## 2023-09-29 ENCOUNTER — Encounter: Payer: Self-pay | Admitting: Neurosurgery

## 2023-09-30 ENCOUNTER — Other Ambulatory Visit: Payer: Self-pay

## 2023-09-30 ENCOUNTER — Telehealth: Payer: Self-pay

## 2023-09-30 DIAGNOSIS — Z01818 Encounter for other preprocedural examination: Secondary | ICD-10-CM

## 2023-09-30 DIAGNOSIS — M5416 Radiculopathy, lumbar region: Secondary | ICD-10-CM

## 2023-09-30 NOTE — Telephone Encounter (Signed)
 She is ready to just pick a date.

## 2023-09-30 NOTE — Telephone Encounter (Signed)
 Planned surgery: Right L4-5 microdiscectomy   Surgery date: 10/14/23 at [facility]: [address] [place], [place] [address]) - you will find out your arrival time the business day before your surgery.   Pre-op appointment at [facility] Pre-admit Testing: we will call you with a date/time for this. If you are scheduled for an in person appointment, Pre-admit Testing is located on the first floor of the [facility] building, [address], [address]. Please bring all prescriptions in the original prescription bottles to your appointment. During this appointment, they will advise you which medications you can take the morning of surgery, and which medications you will need to hold for surgery (I have listed a couple of the medications below, but they will likely give you additional ones at your pre-op appointment). Labs (such as blood work, EKG) may be done at your pre-op appointment. You are not required to fast for these labs. Should you need to change your pre-op appointment, please call Pre-admit testing at [phone].    Psoriatic arthritis medication:  Regina Barnes: hold for 4 days prior to surgery, can resume 2 weeks after surgery   Diabetes medication:  Metformin : hold for 2 days prior to surgery, can resume after surgery    Surgical clearance: we will send a clearance form to Dr Tobie (Rheumatology) and Rollene Northern, FNP (Primary care). They may wish to see you in their office prior to signing the clearance form. If so, they may call you to schedule an appointment.    Common restrictions after surgery: No bending, lifting, or twisting ("BLT"). Avoid lifting objects heavier than 10 pounds for the first 6 weeks after surgery. Where possible, avoid household activities that involve lifting, bending, reaching, pushing, or pulling such as laundry, vacuuming, grocery shopping, and childcare. Try to arrange for help from friends and  family for these activities while you heal. Do not drive while taking prescription pain medication. Weeks 6 through 12 after surgery: avoid lifting more than 25 pounds.     How to contact us :  If you have any questions/concerns before or after surgery, you can reach us  at [phone], or you can send a mychart message. We can be reached by phone or mychart 8am-4pm, Monday-Friday.  *Please note: Calls after 4pm are forwarded to a third party answering service. Mychart messages are not routinely monitored during evenings, weekends, and holidays. Please call our office to contact the answering service for urgent concerns during non-business hours.    If you have FMLA/disability paperwork, please drop it off or fax it to [phone], attention Patty.   Appointments/FMLA & disability paperwork: Regina Barnes, & Regina Barnes Registered Nurse/Surgery scheduler: Regina Barnes Medical Assistants: Regina Barnes Physician Assistants: Regina Decamp, PA-C, Regina Goods, PA-C & Regina Boys, PA-C Surgeons: Regina Daisy, MD & Regina Sharps, MD

## 2023-10-03 ENCOUNTER — Encounter: Payer: Self-pay | Admitting: Family

## 2023-10-07 ENCOUNTER — Other Ambulatory Visit: Payer: Self-pay

## 2023-10-07 ENCOUNTER — Encounter
Admission: RE | Admit: 2023-10-07 | Discharge: 2023-10-07 | Disposition: A | Discharge status: Home / Self Care | Payer: 59 | Source: Ambulatory Visit | Attending: Neurosurgery | Admitting: Neurosurgery

## 2023-10-07 VITALS — BP 125/86 | HR 78 | Temp 98.2°F | Resp 18 | Ht 67.0 in | Wt 183.4 lb

## 2023-10-07 DIAGNOSIS — Z01812 Encounter for preprocedural laboratory examination: Secondary | ICD-10-CM

## 2023-10-07 DIAGNOSIS — Z01810 Encounter for preprocedural cardiovascular examination: Secondary | ICD-10-CM | POA: Diagnosis not present

## 2023-10-07 DIAGNOSIS — Z01818 Encounter for other preprocedural examination: Secondary | ICD-10-CM | POA: Diagnosis present

## 2023-10-07 DIAGNOSIS — E119 Type 2 diabetes mellitus without complications: Secondary | ICD-10-CM | POA: Diagnosis not present

## 2023-10-07 DIAGNOSIS — Z7189 Other specified counseling: Secondary | ICD-10-CM | POA: Insufficient documentation

## 2023-10-07 DIAGNOSIS — I10 Essential (primary) hypertension: Secondary | ICD-10-CM | POA: Insufficient documentation

## 2023-10-07 LAB — URINALYSIS, COMPLETE (UACMP) WITH MICROSCOPIC
Bacteria, UA: NONE SEEN
Bilirubin Urine: NEGATIVE
Glucose, UA: NEGATIVE mg/dL
Hgb urine dipstick: NEGATIVE
Ketones, ur: NEGATIVE mg/dL
Leukocytes,Ua: NEGATIVE
Nitrite: NEGATIVE
Protein, ur: NEGATIVE mg/dL
Specific Gravity, Urine: 1.015 (ref 1.005–1.030)
pH: 5 (ref 5.0–8.0)

## 2023-10-07 LAB — BASIC METABOLIC PANEL
Anion gap: 10 (ref 5–15)
BUN: 18 mg/dL (ref 6–20)
CO2: 24 mmol/L (ref 22–32)
Calcium: 9.1 mg/dL (ref 8.9–10.3)
Chloride: 101 mmol/L (ref 98–111)
Creatinine, Ser: 0.72 mg/dL (ref 0.44–1.00)
GFR, Estimated: >60 mL/min (ref >60)
Glucose, Bld: 134 mg/dL — ABNORMAL HIGH (ref 70–99)
Potassium: 3.7 mmol/L (ref 3.5–5.1)
Sodium: 135 mmol/L (ref 135–145)

## 2023-10-07 LAB — TYPE AND SCREEN
ABO/RH(D): O POS
Antibody Screen: NEGATIVE

## 2023-10-07 LAB — SURGICAL PCR SCREEN
MRSA, PCR: NEGATIVE
Staphylococcus aureus: NEGATIVE

## 2023-10-07 NOTE — Patient Instructions (Addendum)
Your procedure is scheduled on: Wednesday 10/14/23  Report to the Registration Desk on the 1st floor of the [facility]. To find out your arrival time, please call [phone] between 1PM - 3PM on: Tuesday 10/13/23  If your arrival time is 6:00 am, do not arrive before that time as the [facility] entrance doors do not open until 6:00 am.  REMEMBER: Instructions that are not followed completely may result in serious medical risk, up to and including death; or upon the discretion of your surgeon and anesthesiologist your surgery may need to be rescheduled.  Do not eat food after midnight the night before surgery.  No gum chewing or hard candies.  You may however, drink CLEAR liquids up to 2 hours before you are scheduled to arrive for your surgery. Do not drink anything within 2 hours of your scheduled arrival time.  Clear liquids include: - water  Do NOT drink anything that is not on this list.   One week prior to surgery: Stop Anti-inflammatories (NSAIDS) such as Advil, Aleve, Ibuprofen, Motrin, Naproxen, Naprosyn and Aspirin based products such as Excedrin, Goody's Powder, BC Powder. Stop ANY OVER THE COUNTER supplements until after surgery. B Complex-C (B-COMPLEX WITH VITAMIN C)  Cholecalciferol (VITAMIN D) 50 MCG (2000 UT)  MAGNESIUM GLYCINATE PO  You may however, continue to take Tylenol if needed for pain up until the day of surgery.  Stop metFORMIN (GLUCOPHAGE) 1000 MG 2 days prior to surgery (take last dose Sunday 10/11/23) Stop XELJANZ 5 MG 4 days prior to surgery (take last dose Friday 10/09/23)  Continue taking all of your other prescription medications up until the day of surgery.  ON THE DAY OF SURGERY ONLY TAKE THESE MEDICATIONS WITH SIPS OF WATER:  gabapentin (NEURONTIN) 300 MG    No Alcohol for 24 hours before or after surgery.  No Smoking including e-cigarettes for 24 hours before surgery.  No chewable tobacco products for at least 6 hours before surgery.  No  nicotine patches on the day of surgery.  Do not use any "recreational" drugs for at least a week (preferably 2 weeks) before your surgery.  Please be advised that the combination of cocaine and anesthesia may have negative outcomes, up to and including death. If you test positive for cocaine, your surgery will be cancelled.  On the morning of surgery brush your teeth with toothpaste and water, you may rinse your mouth with mouthwash if you wish. Do not swallow any toothpaste or mouthwash.  Use CHG Soap or wipes as directed on instruction sheet.  Do not wear jewelry, make-up, hairpins, clips or nail polish.  For welded (permanent) jewelry: bracelets, anklets, waist bands, etc.  Please have this removed prior to surgery.  If it is not removed, there is a chance that hospital personnel will need to cut it off on the day of surgery.  Do not wear lotions, powders, or perfumes.   Do not shave body hair from the neck down 48 hours before surgery.  Contact lenses, hearing aids and dentures may not be worn into surgery.  Do not bring valuables to the hospital. [facility] is not responsible for any missing/lost belongings or valuables.   Total Shoulder Arthroplasty:  use Benzoyl Peroxide 5% Gel as directed on instruction sheet.  Bring your C-PAP to the hospital in case you may have to spend the night.   Notify your doctor if there is any change in your medical condition (cold, fever, infection).  Wear comfortable clothing (specific  to your surgery type) to the hospital.  After surgery, you can help prevent lung complications by doing breathing exercises.  Take deep breaths and cough every 1-2 hours. Your doctor may order a device called an Incentive Spirometer to help you take deep breaths. When coughing or sneezing, hold a pillow firmly against your incision with both hands. This is called "splinting." Doing this helps protect your incision. It also decreases belly discomfort.  If you are  being admitted to the hospital overnight, leave your suitcase in the car. After surgery it may be brought to your room.  In case of increased patient census, it may be necessary for you, the patient, to continue your postoperative care in the Same Day Surgery department.  If you are being discharged the day of surgery, you will not be allowed to drive home. You will need a responsible individual to drive you home and stay with you for 24 hours after surgery.   If you are taking public transportation, you will need to have a responsible individual with you.  Please call the Pre-admissions Testing Dept. at [phone] if you have any questions about these instructions.  Surgery Visitation Policy:  Patients having surgery or a procedure may have two visitors.  Children under the age of 39 must have an adult with them who is not the patient.  Temporary Visitor Restrictions Due to increasing cases of flu, RSV and COVID-19: Children ages 69 and under will not be able to visit patients in [facility] hospitals under most circumstances.  Inpatient Visitation:    Visiting hours are 7 a.m. to 8 p.m. Up to four visitors are allowed at one time in a patient room. The visitors may rotate out with other people during the day.  One visitor age 68 or older may stay with the patient overnight and must be in the room by 8 p.m.    Pre-operative 5 CHG Bath Instructions   You can play a key role in reducing the risk of infection after surgery. Your skin needs to be as free of germs as possible. You can reduce the number of germs on your skin by washing with CHG (chlorhexidine gluconate) soap before surgery. CHG is an antiseptic soap that kills germs and continues to kill germs even after washing.   DO NOT use if you have an allergy to chlorhexidine/CHG or antibacterial soaps. If your skin becomes reddened or irritated, stop using the CHG and notify one of our RNs at [phone].   Please shower with  the CHG soap starting 4 days before surgery using the following schedule:     Please keep in mind the following:  DO NOT shave, including legs and underarms, starting the day of your first shower.   You may shave your face at any point before/day of surgery.  Place clean sheets on your bed the day you start using CHG soap. Use a clean washcloth (not used since being washed) for each shower. DO NOT sleep with pets once you start using the CHG.   CHG Shower Instructions:  If you choose to wash your hair and private area, wash first with your normal shampoo/soap.  After you use shampoo/soap, rinse your hair and body thoroughly to remove shampoo/soap residue.  Turn the water OFF and apply about 3 tablespoons (45 ml) of CHG soap to a CLEAN washcloth.  Apply CHG soap ONLY FROM YOUR NECK DOWN TO YOUR TOES (washing for 3-5 minutes)  DO NOT use CHG soap on  face, private areas, open wounds, or sores.  Pay special attention to the area where your surgery is being performed.  If you are having back surgery, having someone wash your back for you may be helpful. Wait 2 minutes after CHG soap is applied, then you may rinse off the CHG soap.  Pat dry with a clean towel  Put on clean clothes/pajamas   If you choose to wear lotion, please use ONLY the CHG-compatible lotions on the back of this paper.     Additional instructions for the day of surgery: DO NOT APPLY any lotions, deodorants, cologne, or perfumes.   Put on clean/comfortable clothes.  Brush your teeth.  Ask your nurse before applying any prescription medications to the skin.      CHG Compatible Lotions   Aveeno Moisturizing lotion  Cetaphil Moisturizing Cream  Cetaphil Moisturizing Lotion  Clairol Herbal Essence Moisturizing Lotion, Dry Skin  Clairol Herbal Essence Moisturizing Lotion, Extra Dry Skin  Clairol Herbal Essence Moisturizing Lotion, Normal Skin  Curel Age Defying Therapeutic Moisturizing Lotion with Alpha Hydroxy   Curel Extreme Care Body Lotion  Curel Soothing Hands Moisturizing Hand Lotion  Curel Therapeutic Moisturizing Cream, Fragrance-Free  Curel Therapeutic Moisturizing Lotion, Fragrance-Free  Curel Therapeutic Moisturizing Lotion, Original Formula  Eucerin Daily Replenishing Lotion  Eucerin Dry Skin Therapy Plus Alpha Hydroxy Crme  Eucerin Dry Skin Therapy Plus Alpha Hydroxy Lotion  Eucerin Original Crme  Eucerin Original Lotion  Eucerin Plus Crme Eucerin Plus Lotion  Eucerin TriLipid Replenishing Lotion  Keri Anti-Bacterial Hand Lotion  Keri Deep Conditioning Original Lotion Dry Skin Formula Softly Scented  Keri Deep Conditioning Original Lotion, Fragrance Free Sensitive Skin Formula  Keri Lotion Fast Absorbing Fragrance Free Sensitive Skin Formula  Keri Lotion Fast Absorbing Softly Scented Dry Skin Formula  Keri Original Lotion  Keri Skin Renewal Lotion Keri Silky Smooth Lotion  Keri Silky Smooth Sensitive Skin Lotion  Nivea Body Creamy Conditioning Oil  Nivea Body Extra Enriched Teacher, adult education Moisturizing Lotion Nivea Crme  Nivea Skin Firming Lotion  NutraDerm 30 Skin Lotion  NutraDerm Skin Lotion  NutraDerm Therapeutic Skin Cream  NutraDerm Therapeutic Skin Lotion  ProShield Protective Hand Cream  Provon moisturizing lotion

## 2023-10-08 ENCOUNTER — Telehealth: Payer: Self-pay

## 2023-10-08 NOTE — Telephone Encounter (Signed)
Spoke to after hours call center. Stated they would notify office to have office send over clearance form

## 2023-10-08 NOTE — Telephone Encounter (Signed)
Copied from CRM [id number]. Topic: Clinical - Home Health Verbal Orders >> Oct 08, 2023  3:52 PM Adele Barthel wrote: Caller/Agency: Lanora Manis the practice leader for [facility] Callback Number: [phone] Service Requested: Lanora Manis is calling regarding status of pre surgery clearance form that was faxed from their office. The surgery is scheduled for 10/14/2023. She is requesting a call back.  Frequency: N/a Any new concerns about the patient? No

## 2023-10-09 NOTE — Telephone Encounter (Signed)
Signed neurosurgery procedure form Please fax back and let pt know done Placed on jenate's desk

## 2023-10-09 NOTE — Telephone Encounter (Signed)
Faxed paperwork to [facility] and called to confirm that they received, spoke to receptionist they did receive fax already

## 2023-10-14 ENCOUNTER — Ambulatory Visit: Payer: 59 | Admitting: Urgent Care

## 2023-10-14 ENCOUNTER — Encounter: Payer: Self-pay | Admitting: Neurosurgery

## 2023-10-14 ENCOUNTER — Other Ambulatory Visit: Payer: Self-pay

## 2023-10-14 ENCOUNTER — Encounter
Admission: RE | Disposition: A | Discharge status: Home / Self Care | Payer: Self-pay | Source: Home / Self Care | Attending: Neurosurgery

## 2023-10-14 ENCOUNTER — Ambulatory Visit
Admission: RE | Admit: 2023-10-14 | Discharge: 2023-10-14 | Disposition: A | Discharge status: Home / Self Care | Payer: 59 | Attending: Neurosurgery | Admitting: Neurosurgery

## 2023-10-14 ENCOUNTER — Ambulatory Visit: Payer: 59

## 2023-10-14 DIAGNOSIS — E119 Type 2 diabetes mellitus without complications: Secondary | ICD-10-CM | POA: Diagnosis not present

## 2023-10-14 DIAGNOSIS — M5416 Radiculopathy, lumbar region: Secondary | ICD-10-CM | POA: Diagnosis not present

## 2023-10-14 DIAGNOSIS — M5116 Intervertebral disc disorders with radiculopathy, lumbar region: Secondary | ICD-10-CM | POA: Insufficient documentation

## 2023-10-14 DIAGNOSIS — I10 Essential (primary) hypertension: Secondary | ICD-10-CM | POA: Insufficient documentation

## 2023-10-14 DIAGNOSIS — Z87891 Personal history of nicotine dependence: Secondary | ICD-10-CM | POA: Insufficient documentation

## 2023-10-14 DIAGNOSIS — Z0189 Encounter for other specified special examinations: Secondary | ICD-10-CM | POA: Diagnosis not present

## 2023-10-14 DIAGNOSIS — Z01818 Encounter for other preprocedural examination: Secondary | ICD-10-CM

## 2023-10-14 HISTORY — PX: LUMBAR LAMINECTOMY/DECOMPRESSION MICRODISCECTOMY: SHX5026

## 2023-10-14 LAB — GLUCOSE, CAPILLARY
Glucose-Capillary: 187 mg/dL — ABNORMAL HIGH (ref 70–99)
Glucose-Capillary: 156 mg/dL — ABNORMAL HIGH (ref 70–99)

## 2023-10-14 LAB — ABO/RH: ABO/RH(D): O POS

## 2023-10-14 SURGERY — LUMBAR LAMINECTOMY/DECOMPRESSION MICRODISCECTOMY 1 LEVEL
Anesthesia: General | Site: Spine Lumbar | Laterality: Right

## 2023-10-14 MED ORDER — SUCCINYLCHOLINE CHLORIDE 200 MG/10ML IV SOSY
PREFILLED_SYRINGE | INTRAVENOUS | Status: DC | PRN
  Administered 2023-10-14: 100 mg via INTRAVENOUS

## 2023-10-14 MED ORDER — MIDAZOLAM HCL 2 MG/2ML IJ SOLN
INTRAMUSCULAR | Status: AC
  Filled 2023-10-14: qty 2

## 2023-10-14 MED ORDER — SODIUM CHLORIDE (PF) 0.9 % IJ SOLN
INTRAMUSCULAR | Status: AC
  Filled 2023-10-14: qty 20

## 2023-10-14 MED ORDER — FENTANYL CITRATE (PF) 100 MCG/2ML IJ SOLN
INTRAMUSCULAR | Status: DC | PRN
  Administered 2023-10-14: 100 ug via INTRAVENOUS

## 2023-10-14 MED ORDER — OXYCODONE HCL 5 MG PO TABS: 5.0000 mg | ORAL_TABLET | Freq: Once | ORAL | Status: DC | PRN

## 2023-10-14 MED ORDER — DEXAMETHASONE SODIUM PHOSPHATE 10 MG/ML IJ SOLN
INTRAMUSCULAR | Status: DC | PRN
Start: 2023-10-14 — End: 2023-10-14
  Administered 2023-10-14: 10 mg via INTRAVENOUS

## 2023-10-14 MED ORDER — ACETAMINOPHEN 10 MG/ML IV SOLN
INTRAVENOUS | Status: AC
  Filled 2023-10-14: qty 100

## 2023-10-14 MED ORDER — ACETAMINOPHEN 10 MG/ML IV SOLN
INTRAVENOUS | Status: DC | PRN
  Administered 2023-10-14: 1000 mg via INTRAVENOUS

## 2023-10-14 MED ORDER — MIDAZOLAM HCL 2 MG/2ML IJ SOLN
INTRAMUSCULAR | Status: DC | PRN
  Administered 2023-10-14: 2 mg via INTRAVENOUS

## 2023-10-14 MED ORDER — OXYCODONE HCL 5 MG PO TABS: 5.0000 mg | ORAL_TABLET | ORAL | 0 refills | Status: DC | PRN

## 2023-10-14 MED ORDER — METHYLPREDNISOLONE ACETATE 40 MG/ML IJ SUSP
INTRAMUSCULAR | Status: AC
  Filled 2023-10-14: qty 1

## 2023-10-14 MED ORDER — ONDANSETRON HCL 4 MG/2ML IJ SOLN
INTRAMUSCULAR | Status: DC | PRN
  Administered 2023-10-14: 4 mg via INTRAVENOUS

## 2023-10-14 MED ORDER — FENTANYL CITRATE (PF) 100 MCG/2ML IJ SOLN
INTRAMUSCULAR | Status: AC
  Filled 2023-10-14: qty 2

## 2023-10-14 MED ORDER — OXYCODONE HCL 5 MG/5ML PO SOLN: 5.0000 mg | Freq: Once | ORAL | Status: DC | PRN

## 2023-10-14 MED ORDER — KETAMINE HCL 50 MG/5ML IJ SOSY
PREFILLED_SYRINGE | INTRAMUSCULAR | Status: DC | PRN
  Administered 2023-10-14: 10 mg via INTRAVENOUS
  Administered 2023-10-14: 20 mg via INTRAVENOUS
  Administered 2023-10-14 (×2): 10 mg via INTRAVENOUS

## 2023-10-14 MED ORDER — CEFAZOLIN SODIUM-DEXTROSE 2-4 GM/100ML-% IV SOLN
2.0000 g | Freq: Once | INTRAVENOUS | Status: AC
  Administered 2023-10-14: 2 g via INTRAVENOUS

## 2023-10-14 MED ORDER — CHLORHEXIDINE GLUCONATE 0.12 % MT SOLN
OROMUCOSAL | Status: AC
  Filled 2023-10-14: qty 15

## 2023-10-14 MED ORDER — BUPIVACAINE-EPINEPHRINE (PF) 0.5% -1:200000 IJ SOLN
INTRAMUSCULAR | Status: DC | PRN
  Administered 2023-10-14: 7 mL

## 2023-10-14 MED ORDER — 0.9 % SODIUM CHLORIDE (POUR BTL) OPTIME
TOPICAL | Status: DC | PRN
  Administered 2023-10-14: 500 mL

## 2023-10-14 MED ORDER — METHOCARBAMOL 500 MG PO TABS: 500.0000 mg | ORAL_TABLET | Freq: Four times a day (QID) | ORAL | 0 refills | Status: DC

## 2023-10-14 MED ORDER — LIDOCAINE HCL (CARDIAC) PF 100 MG/5ML IV SOSY
PREFILLED_SYRINGE | INTRAVENOUS | Status: DC | PRN
  Administered 2023-10-14 (×2): 100 mg via INTRAVENOUS

## 2023-10-14 MED ORDER — ACETAMINOPHEN 10 MG/ML IV SOLN: 1000.0000 mg | Freq: Once | INTRAVENOUS | Status: DC | PRN

## 2023-10-14 MED ORDER — FIBRIN SEALANT 2 ML SINGLE DOSE KIT
PACK | CUTANEOUS | Status: DC | PRN
Start: 2023-10-14 — End: 2023-10-14
  Administered 2023-10-14: 2 mL via TOPICAL

## 2023-10-14 MED ORDER — SODIUM CHLORIDE (PF) 0.9 % IJ SOLN
INTRAMUSCULAR | Status: DC | PRN
  Administered 2023-10-14: 60 mL via INTRAMUSCULAR

## 2023-10-14 MED ORDER — CEFAZOLIN SODIUM-DEXTROSE 2-4 GM/100ML-% IV SOLN
INTRAVENOUS | Status: AC
  Filled 2023-10-14: qty 100

## 2023-10-14 MED ORDER — SENNA 8.6 MG PO TABS: 1.0000 | ORAL_TABLET | Freq: Two times a day (BID) | ORAL | 0 refills | Status: DC | PRN

## 2023-10-14 MED ORDER — PROPOFOL 10 MG/ML IV BOLUS
INTRAVENOUS | Status: DC | PRN
  Administered 2023-10-14: 200 mg via INTRAVENOUS

## 2023-10-14 MED ORDER — LACTATED RINGERS IV SOLN: INTRAVENOUS | Status: DC

## 2023-10-14 MED ORDER — SODIUM CHLORIDE 0.9 % IV SOLN: INTRAVENOUS | Status: DC

## 2023-10-14 MED ORDER — FIBRIN SEALANT 2 ML SINGLE DOSE KIT
PACK | CUTANEOUS | Status: AC
  Filled 2023-10-14: qty 2

## 2023-10-14 MED ORDER — PHENYLEPHRINE 80 MCG/ML (10ML) SYRINGE FOR IV PUSH (FOR BLOOD PRESSURE SUPPORT)
PREFILLED_SYRINGE | INTRAVENOUS | Status: DC | PRN
  Administered 2023-10-14: 80 ug via INTRAVENOUS
  Administered 2023-10-14 (×2): 160 ug via INTRAVENOUS

## 2023-10-14 MED ORDER — BUPIVACAINE LIPOSOME 1.3 % IJ SUSP
INTRAMUSCULAR | Status: AC
  Filled 2023-10-14: qty 20

## 2023-10-14 MED ORDER — BUPIVACAINE-EPINEPHRINE (PF) 0.5% -1:200000 IJ SOLN
INTRAMUSCULAR | Status: AC
  Filled 2023-10-14: qty 30

## 2023-10-14 MED ORDER — ONDANSETRON HCL 4 MG/2ML IJ SOLN: 4.0000 mg | Freq: Once | INTRAMUSCULAR | Status: DC | PRN

## 2023-10-14 MED ORDER — KETAMINE HCL 50 MG/5ML IJ SOSY
PREFILLED_SYRINGE | INTRAMUSCULAR | Status: AC
  Filled 2023-10-14: qty 5

## 2023-10-14 MED ORDER — SURGIFLO WITH THROMBIN (HEMOSTATIC MATRIX KIT) OPTIME
TOPICAL | Status: DC | PRN
  Administered 2023-10-14: 1

## 2023-10-14 MED ORDER — FENTANYL CITRATE (PF) 100 MCG/2ML IJ SOLN
25.0000 ug | INTRAMUSCULAR | Status: DC | PRN
  Administered 2023-10-14: 50 ug via INTRAVENOUS

## 2023-10-14 MED ORDER — ORAL CARE MOUTH RINSE: 15.0000 mL | Freq: Once | OROMUCOSAL | Status: AC

## 2023-10-14 MED ORDER — CHLORHEXIDINE GLUCONATE 0.12 % MT SOLN
15.0000 mL | Freq: Once | OROMUCOSAL | Status: AC
  Administered 2023-10-14: 15 mL via OROMUCOSAL

## 2023-10-14 MED ORDER — BUPIVACAINE HCL (PF) 0.5 % IJ SOLN
INTRAMUSCULAR | Status: AC
  Filled 2023-10-14: qty 30

## 2023-10-14 SURGICAL SUPPLY — 36 items
BASIN KIT SINGLE STR (MISCELLANEOUS) ×1 IMPLANT
BUR NEURO DRILL SOFT 3.0X3.8M (BURR) ×1 IMPLANT
DERMABOND ADVANCED .7 DNX12 (GAUZE/BANDAGES/DRESSINGS) ×1 IMPLANT
DRAPE C ARM PK CFD 31 SPINE (DRAPES) ×1 IMPLANT
DRAPE LAPAROTOMY 100X77 ABD (DRAPES) ×1 IMPLANT
DRAPE MICROSCOPE SPINE 48X150 (DRAPES) ×1 IMPLANT
DRSG OPSITE POSTOP 3X4 (GAUZE/BANDAGES/DRESSINGS) ×1 IMPLANT
ELECT EZSTD 165MM 6.5IN (MISCELLANEOUS) ×1 IMPLANT
ELECT REM PT RETURN 9FT ADLT (ELECTROSURGICAL) ×1 IMPLANT
ELECTRODE EZSTD 165MM 6.5IN (MISCELLANEOUS) ×1 IMPLANT
ELECTRODE REM PT RTRN 9FT ADLT (ELECTROSURGICAL) ×1 IMPLANT
GLOVE BIOGEL PI IND STRL 6.5 (GLOVE) ×1 IMPLANT
GLOVE SURG SYN 6.5 ES PF (GLOVE) ×1 IMPLANT
GLOVE SURG SYN 6.5 PF PI (GLOVE) ×1 IMPLANT
GLOVE SURG SYN 8.5 E (GLOVE) ×3 IMPLANT
GLOVE SURG SYN 8.5 PF PI (GLOVE) ×3 IMPLANT
GOWN SRG LRG LVL 4 IMPRV REINF (GOWNS) ×1 IMPLANT
GOWN SRG XL LVL 3 NONREINFORCE (GOWNS) ×1 IMPLANT
GRAFT DURAGEN MATRIX 1WX1L (Tissue) IMPLANT
KIT SPINAL PRONEVIEW (KITS) ×1 IMPLANT
MANIFOLD NEPTUNE II (INSTRUMENTS) ×1 IMPLANT
MARKER SKIN DUAL TIP RULER LAB (MISCELLANEOUS) ×1 IMPLANT
NDL SAFETY ECLIPSE 18X1.5 (NEEDLE) ×1 IMPLANT
NS IRRIG 500ML POUR BTL (IV SOLUTION) ×1 IMPLANT
PACK LAMINECTOMY ARMC (PACKS) ×1 IMPLANT
PAD ARMBOARD 7.5X6 YLW CONV (MISCELLANEOUS) ×1 IMPLANT
SURGIFLO W/THROMBIN 8M KIT (HEMOSTASIS) ×1 IMPLANT
SUT ETHILON 3-0 FS-10 30 BLK (SUTURE) ×1 IMPLANT
SUT STRATA 3-0 15 PS-2 (SUTURE) ×1 IMPLANT
SUT VIC AB 0 CT1 27XCR 8 STRN (SUTURE) ×1 IMPLANT
SUT VIC AB 2-0 CT1 18 (SUTURE) ×1 IMPLANT
SUTURE EHLN 3-0 FS-10 30 BLK (SUTURE) IMPLANT
SYR 30ML LL (SYRINGE) ×2 IMPLANT
SYR 3ML LL SCALE MARK (SYRINGE) ×1 IMPLANT
TRAP FLUID SMOKE EVACUATOR (MISCELLANEOUS) ×1 IMPLANT
WATER STERILE IRR 500ML POUR (IV SOLUTION) IMPLANT

## 2023-10-14 NOTE — Discharge Instructions (Addendum)
 Your surgeon has performed an operation on your lumbar spine (low back) to relieve pressure on one or more nerves. Many times, patients feel better immediately after surgery and can "overdo it." Even if you feel well, it is important that you follow these activity guidelines. If you do not let your back heal properly from the surgery, you can increase the chance of a disc herniation and/or return of your symptoms. The following are instructions to help in your recovery once you have been discharged from the hospital.  * It is ok to take NSAIDs after surgery.  Activity    No bending, lifting, or twisting ("BLT"). Avoid lifting objects heavier than 10 pounds (gallon milk jug).  Where possible, avoid household activities that involve lifting, bending, pushing, or pulling such as laundry, vacuuming, grocery shopping, and childcare. Try to arrange for help from friends and family for these activities while your back heals.  Increase physical activity slowly as tolerated.  Taking short walks is encouraged, but avoid strenuous exercise. Do not jog, run, bicycle, lift weights, or participate in any other exercises unless specifically allowed by your doctor. Avoid prolonged sitting, including car rides.  Talk to your doctor before resuming sexual activity.  You should not drive until cleared by your doctor.  Until released by your doctor, you should not return to work or school.  You should rest at home and let your body heal.   You may shower three days after your surgery.  After showering, lightly dab your incision dry. Do not take a tub bath or go swimming for 3 weeks, or until approved by your doctor at your follow-up appointment.  If you smoke, we strongly recommend that you quit.  Smoking has been proven to interfere with normal healing in your back and will dramatically reduce the success rate of your surgery. Please contact [facility] (800-QUIT-NOW) and use the resources at www.[facility].com for  assistance in stopping smoking.  Surgical Incision   If you have a dressing on your incision, you may remove it three days after your surgery. Keep your incision area clean and dry.  If you have staples or stitches on your incision, you should have a follow up scheduled for removal. If you do not have staples or stitches, you will have steri-strips (small pieces of surgical tape) or Dermabond glue. The steri-strips/glue should begin to peel away within about a week (it is fine if the steri-strips fall off before then). If the strips are still in place one week after your surgery, you may gently remove them.  Diet            You may return to your usual diet. Be sure to stay hydrated.  When to Contact Regina Barnes  Although your surgery and recovery will likely be uneventful, you may have some residual numbness, aches, and pains in your back and/or legs. This is normal and should improve in the next few weeks.  However, should you experience any of the following, contact Regina Barnes immediately: New numbness or weakness Pain that is progressively getting worse, and is not relieved by your pain medications or rest Bleeding, redness, swelling, pain, or drainage from surgical incision Chills or flu-like symptoms Fever greater than 101.0 F (38.3 C) Problems with bowel or bladder functions Difficulty breathing or shortness of breath Warmth, tenderness, or swelling in your calf  Contact Information How to contact Regina Barnes:  If you have any questions/concerns before or after surgery, you can reach Regina Barnes at [phone], or you can  send a [employer]. We can be reached by phone or mychart 8am-4pm, Monday-Friday.  *Please note: Calls after 4pm are forwarded to a third party answering service. Mychart messages are not routinely monitored during evenings, weekends, and holidays. Please call our office to contact the answering service for urgent concerns during non-business hours.    Information for Discharge Teaching:  DO  NOT REMOVE TEAL EXPAREL BRACELET FOR 4Days, (96 hours), 10/18/2023 EXPAREL (bupivacaine liposome injectable suspension)   Pain relief is important to your recovery. The goal is to control your pain so you can move easier and return to your normal activities as soon as possible after your procedure. Your physician may use several types of medicines to manage pain, swelling, and more.  Your surgeon or anesthesiologist gave you EXPAREL(bupivacaine) to help control your pain after surgery.  EXPAREL is a local anesthetic designed to release slowly over an extended period of time to provide pain relief by numbing the tissue around the surgical site. EXPAREL is designed to release pain medication over time and can control pain for up to 72 hours. Depending on how you respond to EXPAREL, you may require less pain medication during your recovery. EXPAREL can help reduce or eliminate the need for opioids during the first few days after surgery when pain relief is needed the most. EXPAREL is not an opioid and is not addictive. It does not cause sleepiness or sedation.   Important! A teal colored band has been placed on your arm with the date, time and amount of EXPAREL you have received. Please leave this armband in place for the full 96 hours following administration, and then you may remove the band. If you return to the hospital for any reason within 96 hours following the administration of EXPAREL, the armband provides important information that your health care providers to know, and alerts them that you have received this anesthetic.    Possible side effects of EXPAREL:  Temporary loss of sensation or ability to move in the area where medication was injected. Nausea, vomiting, constipation Rarely, numbness and tingling in your mouth or lips, lightheadedness, or anxiety may occur. Call your doctor right away if you think you may be experiencing any of these sensations, or if you have other questions  regarding possible side effects.  Follow all other discharge instructions given to you by your surgeon or nurse. Eat a healthy diet and drink plenty of water or other fluids.

## 2023-10-14 NOTE — H&P (Signed)
 Referring Physician:  No referring provider defined for this encounter.  Primary Physician:  Allegra Grana, FNP  History of Present Illness: 10/14/2023 Regina Barnes presents today with continued right leg pain.   08/25/2023 Regina Barnes is here today with a chief complaint of right lower extremity pain.  She has been having pain in her low back that extends into her right buttock, down her leg to her right ankle.  This been ongoing for 10 months.  She began having the pain in February of this year.  Bending, lifting, standing, walking make it worse.  Sitting in a recliner helps.  Her pain is approximately 5 out of 10.  She did have an injection approximately 2 weeks ago which has helped.  Bowel/Bladder Dysfunction: none  Conservative measures: 10 chiropractic visits Physical therapy:  has participated in at [facility] from 07/13/23 to 07/29/23 Multimodal medical therapy including regular antiinflammatories:  medrol dosepak, meloxicam, robaxin, gabapentin  Injections:  08/07/2023: Right L5-S1 and right S1 transforaminal ESI (dexamethasone 7 mg)  03/18/2023: L4-5 interlaminar ESI (moderate to good relief, Kenalog 80 mg) 02/11/2023: Right L4-5 interlaminar ESI (85%, Kenalog 40 mg)   Past Surgery: no prior spinal surgeries   Regina Barnes has no symptoms of cervical myelopathy.  The symptoms are causing a significant impact on the patient's life.   I have utilized the care everywhere function in epic to review the outside records available from external health systems.  Review of Systems:  A 10 point review of systems is negative, except for the pertinent positives and negatives detailed in the HPI.  Past Medical History: Past Medical History:  Diagnosis Date   Arthritis    Rheumatoid   COVID-19    09/2019, 09/03/20   Diabetes mellitus without complication (HCC)    Hyperlipidemia    Hypertension    Psoriatic arthritis (HCC)    Tobacco abuse     Past  Surgical History: Past Surgical History:  Procedure Laterality Date   COLONOSCOPY WITH PROPOFOL N/A 12/19/2016   Procedure: COLONOSCOPY WITH PROPOFOL;  Surgeon: Wyline Mood, MD;  Location: [facility] ENDOSCOPY;  Service: Endoscopy;  Laterality: N/A;   WISDOM TOOTH EXTRACTION      Allergies: Allergies as of 09/30/2023 - Review Complete 08/25/2023  Allergen Reaction Noted   Codeine Rash 12/01/2012    Medications:  Current Facility-Administered Medications:    0.9 %  sodium chloride infusion, , Intravenous, Continuous, Lenard Simmer, MD   ceFAZolin (ANCEF) IVPB 2g/100 mL premix, 2 g, Intravenous, Once, Venetia Night, MD  Social History: Social History   Tobacco Use   Smoking status: Former    Current packs/day: 0.00    Average packs/day: 0.3 packs/day for 20.0 years (6.0 ttl pk-yrs)    Types: Cigarettes    Start date: 12/23/2000    Quit date: 12/23/2020    Years since quitting: 2.8   Smokeless tobacco: Never  Vaping Use   Vaping status: Former   Devices: tried once  Substance Use Topics   Alcohol use: Yes    Alcohol/week: 4.0 standard drinks of alcohol    Types: 4 Cans of beer per week    Comment: weekly   Drug use: No    Family Medical History: Family History  Problem Relation Age of Onset   Heart attack Father 54   Diabetes Other    Breast cancer Maternal Aunt        great aunt >50   Breast cancer Paternal Aunt  great aunt   Hyperlipidemia Mother    Kidney disease Mother    Thyroid cancer Neg Hx     Physical Examination: Vitals:   10/14/23 0859  BP: (!) 147/77  Pulse: 85  Resp: 16  Temp: 98.6 F (37 C)  SpO2: 97%   Heart sounds normal no MRG. Chest Clear to Auscultation Bilaterally.   General: Patient is in no apparent distress. Attention to examination is appropriate.  Neck:   Supple.  Full range of motion.  Respiratory: Patient is breathing without any difficulty.   NEUROLOGICAL:     Awake, alert, oriented to person, place, and time.   Speech is clear and fluent.   Cranial Nerves: Pupils equal round and reactive to light.  Facial tone is symmetric.  Facial sensation is symmetric. Shoulder shrug is symmetric. Tongue protrusion is midline.  There is no pronator drift.  Strength: Side Biceps Triceps Deltoid Interossei Grip Wrist Ext. Wrist Flex.  R 5 5 5 5 5 5 5   L 5 5 5 5 5 5 5    Side Iliopsoas Quads Hamstring PF DF EHL  R 5 5 5 5 5 5   L 5 5 5 5 5 5    Reflexes are 1+ and symmetric at the biceps, triceps, brachioradialis, patella and achilles.   Hoffman's is absent.   Bilateral upper and lower extremity sensation is intact to light touch.    No evidence of dysmetria noted.  Gait is antalgic.   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  L5-S1 central disc herniation with abutment of the bilateral descending S1 nerve roots.   I have personally reviewed the images and agree with the above interpretation.  Assessment and Plan: Ms. Felix is a pleasant 58 y.o. 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    We will proceed with R L4/5 microdiscectomy.  Risks and benefits reviewed in clinic.     Regina Barnes K. Myer Haff MD, [facility]

## 2023-10-14 NOTE — Discharge Summary (Signed)
 Physician Discharge Summary  Patient ID: Regina Barnes MRN: [id number] DOB/AGE: 29-Jan-1966 58 y.o.  Admit date: 10/14/2023 Discharge date: 10/14/2023  Admission Diagnoses: lumbar radiculopathy  Discharge Diagnoses:  Active Problems:   Lumbar radiculopathy   Discharged Condition: good  Hospital Course: Regina Barnes presented with lumbar radiculopathy.  She underwent discectomy, which she tolerated well.  Consults: None  Significant Diagnostic Studies: radiology: X-Ray: localization at L4/5  Treatments: surgery: R L4/5 microdiscectomy  Discharge Exam: Blood pressure 121/61, pulse 80, temperature 98.1 F (36.7 C), resp. rate 11, SpO2 100%. General appearance: alert and cooperative CNI MAEW   Disposition: Discharge disposition: 01-Home or Self Care       Discharge Instructions     Discharge patient   Complete by: As directed    Discharge disposition: 01-Home or Self Care   Discharge patient date: 10/14/2023      Allergies as of 10/14/2023       Reactions   Codeine Rash        Medication List     TAKE these medications    B-complex with vitamin C tablet Take 1 tablet by mouth every other day. At night   Contour Next Monitor w/Device Kit 1 Device by Does not apply route daily as needed.   Contour Next Test test strip Generic drug: glucose blood Used to check blood sugars one time a day.   FreeStyle Libre 3 Sensor Misc by Does not apply route.   gabapentin 300 MG capsule Commonly known as: NEURONTIN Take 1 capsule (300 mg total) by mouth 2 (two) times daily. What changed:  how much to take when to take this additional instructions   ibuprofen 200 MG tablet Commonly known as: ADVIL Take 400 mg by mouth every 8 (eight) hours as needed (pain.).   Lancets Misc Used to check blood sugars one time a day. DX E11.9.   MAGNESIUM GLYCINATE PO Take 200 mg by mouth at bedtime.   metFORMIN 1000 MG tablet Commonly known as: GLUCOPHAGE Take 1 tablet  (1,000 mg total) by mouth 2 (two) times daily with a meal.   methocarbamol 500 MG tablet Commonly known as: ROBAXIN Take 1 tablet (500 mg total) by mouth 4 (four) times daily.   oxyCODONE 5 MG immediate release tablet Commonly known as: Roxicodone Take 1 tablet (5 mg total) by mouth every 4 (four) hours as needed for severe pain (pain score 7-10) (refractory to home Tramadol).   rosuvastatin 10 MG tablet Commonly known as: CRESTOR Take 1 tablet (10 mg total) by mouth daily. What changed: when to take this   senna 8.6 MG Tabs tablet Commonly known as: SENOKOT Take 1 tablet (8.6 mg total) by mouth 2 (two) times daily as needed for mild constipation.   traMADol 50 MG tablet Commonly known as: ULTRAM Take 25-50 mg by mouth 2 (two) times daily as needed (pain.).   Vitamin D 50 MCG (2000 UT) Caps Take 2,000 Units by mouth in the morning and at bedtime.   Xeljanz 5 MG Tabs Generic drug: Tofacitinib Citrate Take 5 mg by mouth 2 (two) times daily.        Follow-up Information     Susanne Borders, PA Follow up on 10/29/2023.   Specialty: Neurosurgery Contact information: [address] [place] [address] [phone]                 Signed: Venetia Night 10/14/2023, 12:15 PM

## 2023-10-14 NOTE — Anesthesia Procedure Notes (Signed)
 Procedure Name: Intubation Date/Time: 10/14/2023 10:09 AM  Performed by: Maryla Morrow., CRNAPre-anesthesia Checklist: Patient identified, Patient being monitored, Timeout performed, Emergency Drugs available and Suction available Patient Re-evaluated:Patient Re-evaluated prior to induction Oxygen Delivery Method: Circle system utilized Preoxygenation: Pre-oxygenation with 100% oxygen Induction Type: IV induction Ventilation: Mask ventilation without difficulty Laryngoscope Size: 3 and McGrath Grade View: Grade I Tube type: Oral Tube size: 6.5 mm Number of attempts: 1 Airway Equipment and Method: Stylet Placement Confirmation: ETT inserted through vocal cords under direct vision, positive ETCO2 and breath sounds checked- equal and bilateral Secured at: 21 cm Tube secured with: Tape Dental Injury: Teeth and Oropharynx as per pre-operative assessment

## 2023-10-14 NOTE — Transfer of Care (Signed)
 Immediate Anesthesia Transfer of Care Note  Patient: Regina Barnes  Procedure(s) Performed: RIGHT L4-5 MICRODISCECTOMY (Right: Spine Lumbar)  Patient Location: PACU  Anesthesia Type:General  Level of Consciousness: drowsy and patient cooperative  Airway & Oxygen Therapy: Patient Spontanous Breathing and Patient connected to face mask oxygen  Post-op Assessment: Report given to RN and Post -op Vital signs reviewed and stable  Post vital signs: stable  Last Vitals:  Vitals Value Taken Time  BP 121/61 10/14/23 1203  Temp    Pulse 82 10/14/23 1204  Resp 12 10/14/23 1204  SpO2 99 % 10/14/23 1204  Vitals shown include unfiled device data.  Last Pain:  Vitals:   10/14/23 0859  TempSrc: Temporal  PainSc: 4          Complications: No notable events documented.

## 2023-10-14 NOTE — Anesthesia Postprocedure Evaluation (Signed)
 Anesthesia Post Note  Patient: Regina Barnes  Procedure(s) Performed: RIGHT L4-5 MICRODISCECTOMY (Right: Spine Lumbar)  Patient location during evaluation: PACU Anesthesia Type: General Level of consciousness: awake and alert, oriented and patient cooperative Pain management: pain level controlled Vital Signs Assessment: post-procedure vital signs reviewed and stable Respiratory status: spontaneous breathing, nonlabored ventilation and respiratory function stable Cardiovascular status: blood pressure returned to baseline and stable Postop Assessment: adequate PO intake Anesthetic complications: no   There were no known notable events for this encounter.   Last Vitals:  Vitals:   10/14/23 1230 10/14/23 1245  BP: 127/88 129/85  Pulse: 71 72  Resp: 15 13  Temp:    SpO2: 100% 95%    Last Pain:  Vitals:   10/14/23 1245  TempSrc:   PainSc: 5                  Reed Breech

## 2023-10-14 NOTE — Op Note (Signed)
 Indications: [name] is suffering from lumbar radiculopathy. The patient tried and failed conservative management, prompting surgical intervention.  Findings: disc herniation  Preoperative Diagnosis: Lumbar radiculopathy (ICD-10 M54.16) Postoperative Diagnosis: same   EBL: 10 ml IVF: see anesthesia record Drains: none Disposition: Extubated and Stable to PACU Complications: none  No foley catheter was placed.   Preoperative Note:   Risks of surgery discussed include: infection, bleeding, stroke, coma, death, paralysis, CSF leak, nerve/spinal cord injury, numbness, tingling, weakness, complex regional pain syndrome, recurrent stenosis and/or disc herniation, vascular injury, development of instability, neck/back pain, need for further surgery, persistent symptoms, development of deformity, and the risks of anesthesia. The patient understood these risks and agreed to proceed.  Operative Note:   1) right L4/5 microdiscectomy  The patient was then brought from the preoperative center with intravenous access established.  The patient underwent general anesthesia and endotracheal tube intubation, and was then rotated on the [place] rail top where all pressure points were appropriately padded.  The skin was then thoroughly cleansed.  Perioperative antibiotic prophylaxis was administered.  Sterile prep and drapes were then applied and a timeout was then observed.  C-arm was brought into the field under sterile conditions, and the L4-5 disc space identified and marked with an incision on the right 1cm lateral to midline.  Once this was complete a 2 cm incision was opened with the use of a #10 blade knife.  The Metrx tubes were sequentially advanced under lateral fluoroscopy until a 18 x 70 mm Metrx tube was placed over the facet and lamina and secured to the bed.    The microscope was then sterilely brought into the field and muscle creep was hemostased with a bipolar and resected with a  pituitary rongeur.  A Bovie extender was then used to expose the spinous process and lamina.  Careful attention was placed to not violate the facet capsule. A 3 mm matchstick drill bit was then used to make a hemi-laminotomy trough until the ligamentum flavum was exposed.  This was extended to the base of the spinous process.  Once this was complete and the underlying ligamentum flavum was visualized, the ligamentum was dissected with an up angle curette and resected with a #2 and #3 mm biting Kerrison.  The laminotomy opening was also expanded in similar fashion and hemostasis was obtained with Surgifoam and a patty as well as bone wax.  The rostral aspect of the caudal level of the lamina was also resected with a #2 biting Kerrison effort to further enhance exposure.  Once the underlying dura was visualized a Penfield 4 was then used to dissect and expose the traversing nerve root.  Once this was identified a nerve root retractor suction was used to mobilize this medially.  The venous plexus was hemostased with Surgifoam and light bipolar use.  A small penfied was then used to make a small annulotomy within the disc space and disc space contents were noted to come through the annulus.    The disc herniation was identified and dissected free using a balltip probe. A small portion of the disc herniation was adherent to the nerve root sleeve, causing a small durotomy with dissection.  There was no significant CSF leakage. The pituitary rongeur was used to remove the extruded disc fragments. Once the thecal sac and nerve root were noted to be relaxed and under less tension the ball-tipped feeler was passed along the foramen distally to ensure no residual compression was noted.  Duragen and vistaseal were used to close the durotomy.  The area was irrigated. The tube system was then removed under microscopic visualization and hemostasis was obtained with a bipolar.    The fascial layer was reapproximated with  the use of a 0- Vicryl suture.  Subcutaneous tissue layer was reapproximated using 2-0 Vicryl suture.  3-0 nylon was used on the skin. Patient was then rotated back to the preoperative bed awakened from anesthesia and taken to recovery all counts are correct in this case.   I performed the entire procedure.   Venetia Night MD

## 2023-10-14 NOTE — Anesthesia Preprocedure Evaluation (Addendum)
 Anesthesia Evaluation  Patient identified by MRN, date of birth, ID band Patient awake    Reviewed: Allergy & Precautions, NPO status , Patient's Chart, lab work & pertinent test results  History of Anesthesia Complications Negative for: history of anesthetic complications  Airway Mallampati: IV   Neck ROM: Full    Dental no notable dental hx.    Pulmonary former smoker (quit 2022)   Pulmonary exam normal breath sounds clear to auscultation       Cardiovascular hypertension, Normal cardiovascular exam Rhythm:Regular Rate:Normal  ECG 10/07/23: normal   Neuro/Psych negative neurological ROS     GI/Hepatic negative GI ROS,,,  Endo/Other  diabetes, Type 2    Renal/GU negative Renal ROS     Musculoskeletal  (+) Arthritis , Rheumatoid disorders,    Abdominal   Peds  Hematology negative hematology ROS (+)   Anesthesia Other Findings   Reproductive/Obstetrics                             Anesthesia Physical Anesthesia Plan  ASA: 2  Anesthesia Plan: General   Post-op Pain Management:    Induction: Intravenous  PONV Risk Score and Plan: 3 and Ondansetron, Dexamethasone and Treatment may vary due to age or medical condition  Airway Management Planned: Oral ETT  Additional Equipment:   Intra-op Plan:   Post-operative Plan: Extubation in OR  Informed Consent: I have reviewed the patients History and Physical, chart, labs and discussed the procedure including the risks, benefits and alternatives for the proposed anesthesia with the patient or authorized representative who has indicated his/her understanding and acceptance.     Dental advisory given  Plan Discussed with: CRNA  Anesthesia Plan Comments: (Patient consented for risks of anesthesia including but not limited to:  - adverse reactions to medications - damage to eyes, teeth, lips or other oral mucosa - nerve damage due to  positioning  - sore throat or hoarseness - damage to heart, brain, nerves, lungs, other parts of body or loss of life  Informed patient about role of CRNA in peri- and intra-operative care.  Patient voiced understanding.)        Anesthesia Quick Evaluation

## 2023-10-15 ENCOUNTER — Telehealth: Payer: Self-pay

## 2023-10-15 ENCOUNTER — Encounter: Payer: Self-pay | Admitting: Neurosurgery

## 2023-10-15 NOTE — Telephone Encounter (Signed)
 Regina Barnes called in to inquire when she can remove her compression stockings. I informed her that she can remove them when she is walking a couple hundred feet three times a day.    She is doing OK at this time, her legs are aching. She also asked for clarification regarding the tramadol and oxycodone because the oxycodone rx states "refractory to home tramadol". I explained that the tramadol is for her chronic pain and we do not want her to take extra tramadol for the surgery she had and the oxycodone should be for if the tramadol isn't sufficient. I also reminded her to take methocarbamol as prescribed and that she can add tylenol to her regimen if she can normally tolerate tylenol.

## 2023-10-29 ENCOUNTER — Ambulatory Visit (INDEPENDENT_AMBULATORY_CARE_PROVIDER_SITE_OTHER): Payer: 59 | Admitting: Neurosurgery

## 2023-10-29 VITALS — BP 116/76 | Temp 98.1°F | Ht 67.0 in | Wt 183.0 lb

## 2023-10-29 DIAGNOSIS — Z9889 Other specified postprocedural states: Secondary | ICD-10-CM

## 2023-10-29 NOTE — Progress Notes (Signed)
   REFERRING PHYSICIAN:  Allegra Grana, Fnp [address] [place],  [place] [address]  DOS: 10/14/23 right L4-5 microdiscectomy   HISTORY OF PRESENT ILLNESS: LEANDRIA THIER is approximately 2 weeks status post lumbar discectomy. she is doing well with complete resolution of her preoperative leg pain.  She says she will intermittently have some mild discomfort down her right leg but this resolves with repositioning.  She has stopped her gabapentin, Robaxin, and oxycodone.  She takes tramadol very seldomly.  PHYSICAL EXAMINATION:  General: Patient is well developed, well nourished, calm, collected, and in no apparent distress.   NEUROLOGICAL:  General: In no acute distress.   Awake, alert, oriented to person, place, and time.  Pupils equal round and reactive to light.  Facial tone is symmetric.  Tongue protrusion is midline.  There is no pronator drift.   Strength:            Side Iliopsoas Quads Hamstring PF DF EHL  R 5 5 5 5 5 5   L 5 5 5 5 5 5    Incision c/d/i   ROS (Neurologic):  Negative except as noted above  IMAGING: No interval imaging to review  ASSESSMENT/PLAN:  Regina Barnes is doing well approximately 2 weeks after microdiscectomy.  We discussed activity escalation and I have advised the patient to lift up to 10 pounds until 6 weeks after surgery, then increase up to 25 pounds until 12 weeks after surgery.  After 12 weeks post-op, the patient advised to increase activity as tolerated. she will follow up with Dr. Marcell Barlow in 4 weeks as scheduled or sooner should she have any questions or concerns.  She expressed understanding and was in agreement with this plan.  Manning Charity PA-C Department of neurosurgery

## 2023-11-05 ENCOUNTER — Ambulatory Visit (INDEPENDENT_AMBULATORY_CARE_PROVIDER_SITE_OTHER): Payer: 59 | Admitting: Family

## 2023-11-05 ENCOUNTER — Encounter: Payer: Self-pay | Admitting: Family

## 2023-11-05 VITALS — BP 118/70 | HR 70 | Temp 98.9°F | Ht 67.0 in | Wt 178.4 lb

## 2023-11-05 DIAGNOSIS — Z8249 Family history of ischemic heart disease and other diseases of the circulatory system: Secondary | ICD-10-CM

## 2023-11-05 DIAGNOSIS — Z7984 Long term (current) use of oral hypoglycemic drugs: Secondary | ICD-10-CM

## 2023-11-05 DIAGNOSIS — L659 Nonscarring hair loss, unspecified: Secondary | ICD-10-CM

## 2023-11-05 DIAGNOSIS — E119 Type 2 diabetes mellitus without complications: Secondary | ICD-10-CM

## 2023-11-05 LAB — LIPID PANEL
Cholesterol: 177 mg/dL (ref 0–200)
HDL: 69.9 mg/dL (ref >39.00)
LDL Cholesterol: 84 mg/dL (ref 0–99)
NonHDL: 106.65
Total CHOL/HDL Ratio: 3
Triglycerides: 111 mg/dL (ref 0.0–149.0)
VLDL: 22.2 mg/dL (ref 0.0–40.0)

## 2023-11-05 NOTE — Assessment & Plan Note (Signed)
 CGM GMI 7.2 Chronic, suboptimal control.  Continue metformin.  Appointment next month with endocrinology.  Will follow

## 2023-11-05 NOTE — Progress Notes (Signed)
 Assessment & Plan:  Family history of ASCVD Assessment & Plan: Discussed screening with CT calcium score in the absence of symptoms.  Patient will schedule.   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  Advised over-the-counter Rogaine for women.  She declines lab work to evaluate for nutritional deficiency.  Encouraged hair skin and nail supplement.  Consider dermatology referral   Type 2 diabetes mellitus without complication, without long-term current use of insulin (HCC) Assessment & Plan: CGM GMI 7.2 Chronic, suboptimal control.  Continue metformin.  Appointment next month with endocrinology.  Will follow      Return precautions given.   Risks, benefits, and alternatives of the medications and treatment plan prescribed today were discussed, and patient expressed understanding.   Education regarding symptom management and diagnosis given to patient on AVS either electronically or printed.  Return in about 6 months (around 05/07/2024).  [name], FNP  Subjective:    Patient ID: Regina Barnes, female    DOB: 06/28/1966, 58 y.o.   MRN: [id number]  CC: Regina Barnes is a 58 y.o. female who presents today for follow up.   HPI: Overall feels well today.  family history ASCVD.  Her father passed away at 67 due to MI. She typically stays active , walking daily.  Denies chest pain, shortness of breath.  She is compliant with Crestor 10mg  daily.  She is no longer following with cardiology.  She has noted generalized hair thinning.  No recent virus.    S/p discectomy 09/2023.  She is doing well postoperatively.  She is no longer gabapentin.  She can take tramadol without a rash; using tramadol as needed.   She is compliant with metformin.  She is wearing a CGM.  She is following with endocrinology for  diabetes  Due for CT chest lung cancer screen Allergies: Codeine Current Outpatient Medications on File Prior to Visit  Medication Sig Dispense Refill   B Complex-C (B-COMPLEX WITH VITAMIN C) tablet Take 1 tablet by mouth every other day. At night     Blood Glucose Monitoring Suppl (CONTOUR NEXT MONITOR) w/Device KIT 1 Device by Does not apply route daily as needed. 1 kit 0   Cholecalciferol (VITAMIN D) 50 MCG (2000 UT) CAPS Take 2,000 Units by mouth in the morning and at bedtime.     Continuous Glucose Sensor (FREESTYLE LIBRE 3 SENSOR) MISC by Does not apply route.     glucose blood (CONTOUR NEXT TEST) test strip Used to check blood sugars one time a day. 100 each 4   ibuprofen (ADVIL) 200 MG tablet Take 400 mg by mouth every 8 (eight) hours as needed (pain.).     Lancets MISC Used to check blood sugars one time a day. DX E11.9. 100 each 4   MAGNESIUM GLYCINATE PO Take 200 mg by mouth at bedtime.     metFORMIN (GLUCOPHAGE) 1000 MG tablet Take 1 tablet (1,000 mg total) by mouth 2 (two) times daily with a meal. 180 tablet 3   rosuvastatin (CRESTOR) 10 MG tablet Take 1 tablet (10 mg total) by mouth daily. (Patient taking differently: Take 10 mg by mouth at bedtime.) 90 tablet 3   traMADol (ULTRAM) 50 MG tablet Take 25-50 mg by mouth 2 (two) times daily as needed (pain.).  XELJANZ 5 MG TABS Take 5 mg by mouth 2 (two) times daily.     No current facility-administered medications on file prior to visit.    Review of Systems  Constitutional:  Negative for chills and fever.  Respiratory:  Negative for cough.   Cardiovascular:  Negative for chest pain and palpitations.  Gastrointestinal:  Negative for nausea and vomiting.      Objective:    BP 118/70   Pulse 70   Temp 98.9 F (37.2 C) (Oral)   Ht 5\' 7"  (1.702 m)   Wt 178 lb 6.4 oz (80.9 kg)   LMP  (LMP Unknown)   SpO2 98%   BMI 27.94 kg/m  BP Readings from Last 3 Encounters:  11/05/23 118/70  10/29/23 116/76  10/14/23 118/69    Wt Readings from Last 3 Encounters:  11/05/23 178 lb 6.4 oz (80.9 kg)  10/29/23 183 lb (83 kg)  10/07/23 183 lb 6.4 oz (83.2 kg)    Physical Exam Vitals reviewed.  Constitutional:      Appearance: She is well-developed.  Eyes:     Conjunctiva/sclera: Conjunctivae normal.  Cardiovascular:     Rate and Rhythm: Normal rate and regular rhythm.     Pulses: Normal pulses.     Heart sounds: Normal heart sounds.  Pulmonary:     Effort: Pulmonary effort is normal.     Breath sounds: Normal breath sounds. No wheezing, rhonchi or rales.  Skin:    General: Skin is warm and dry.  Neurological:     Mental Status: She is alert.  Psychiatric:        Speech: Speech normal.        Behavior: Behavior normal.        Thought Content: Thought content normal.

## 2023-11-05 NOTE — Assessment & Plan Note (Signed)
 Discussed screening with CT calcium score in the absence of symptoms.  Patient will schedule.

## 2023-11-05 NOTE — Patient Instructions (Addendum)
  Call to make an appointment for Annual Lung cancer screen  With CT Chest:  [phone]. Let me know if any issues in doing so.   For hair loss , please use OTC Rogaine (minoxidil) 5% solution/foam as directed.    Oral treatments in female patients who have no contraindication may include as managed by dermatology may include below.  - Low dose oral minoxidil 1.25 - 5mg  daily - Spironolactone 50 - 100mg  bid - Finasteride 2.5 - 5 mg daily  Adjunctive therapies include: - Low Level Laser Light Therapy (LLLT) - Platelet-rich plasma injections (PRP) - Hair Transplants or scalp reduction    I have ordered CT calcium score ( SELF PAY option)  to further stratify your overall cardiovascular risk .   An estimate of cost is $150-200 out-of-pocket as not covered by insurance. However please call your insurance company in advance to ensure no other costs so that you do not have any unexpected bills.     I have placed your order to [facility] in [place] as  generally most convenient.   Phone Number to [facility] on [address] is is [phone] to get scheduled.   Please call to get scheduled and if any issues at all in doing so, please let me know.   Below an article from [facility] regarding the test.   [email or web address]-[email or web address]~:[email or web address]%20CT%20calcium%20score,[email or web address]%[email or web address]%[email or web address].   Exams We Offer: Cardiac CT Calcium Score  Knowing your score could save your life. A cardiac CT calcium score, also known as a coronary calcium scan, is a quick, convenient and noninvasive way of evaluating the amount of calcified (hard) plaque in your heart vessels. The level of calcium equates to the extent of plaque build-up in your arteries. Plaque in the arteries can cause heart attacks.  The radiologist reads the images and sends your doctor a report with a calcium score. Patients with  higher scores have a greater risk for a heart attack, heart disease or stroke. Knowing your score can help your doctor decide on blood pressure and cholesterol goals that will minimize your risk as much as possible.  The [employer] of Cardiology found that Coronary artery calcification (CAC) is an excellent cardiovascular disease risk marker and can help guide the decision to use cholesterol reducing medications such as statins. A negative calcium score may reduce the need for statins in otherwise eligible patients.  The exam takes less than 10 minutes, is painless and does not require any IV or oral contrast. At [facility] locations, the out-of-pocket fee without insurance is $75. At the time of scheduling, please let us know if you want to process the exam through your insurance or self-pay at the rate of $75. Patients who want to self-pay should not submit their insurance card when checking in for the appointment.   Who should get a Cardiac CT Calcium Score: Middle age adults at intermediate risk of heart disease Family history of heart disease Borderline high cholesterol, high blood pressure or diabetes Overweight or physical inactivity Uncertain about taking daily preventive medical therapy

## 2023-11-05 NOTE — Assessment & Plan Note (Signed)
 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  Advised over-the-counter Rogaine for women.  She declines lab work to evaluate for nutritional deficiency.  Encouraged hair skin and nail supplement.  Consider dermatology referral

## 2023-11-09 ENCOUNTER — Encounter: Payer: Self-pay | Admitting: Family

## 2023-11-11 ENCOUNTER — Encounter: Payer: Self-pay | Admitting: Neurosurgery

## 2023-11-11 ENCOUNTER — Other Ambulatory Visit: Payer: Self-pay | Admitting: Neurosurgery

## 2023-11-11 ENCOUNTER — Ambulatory Visit
Admission: RE | Admit: 2023-11-11 | Discharge: 2023-11-11 | Disposition: A | Discharge status: Home / Self Care | Payer: Self-pay | Source: Ambulatory Visit | Attending: Family | Admitting: Family

## 2023-11-11 DIAGNOSIS — Z8249 Family history of ischemic heart disease and other diseases of the circulatory system: Secondary | ICD-10-CM | POA: Insufficient documentation

## 2023-11-12 ENCOUNTER — Encounter: Payer: Self-pay | Admitting: Family

## 2023-11-16 ENCOUNTER — Other Ambulatory Visit: Payer: Self-pay

## 2023-11-16 ENCOUNTER — Other Ambulatory Visit: Payer: Self-pay | Admitting: Family

## 2023-11-16 MED ORDER — ROSUVASTATIN CALCIUM 10 MG PO TABS: 10.0000 mg | ORAL_TABLET | Freq: Every day | ORAL | 3 refills | Status: AC

## 2023-11-17 ENCOUNTER — Other Ambulatory Visit: Payer: Self-pay | Admitting: Family

## 2023-11-19 ENCOUNTER — Ambulatory Visit (INDEPENDENT_AMBULATORY_CARE_PROVIDER_SITE_OTHER): Payer: 59 | Admitting: Neurosurgery

## 2023-11-19 VITALS — BP 126/70 | Temp 97.7°F | Ht 67.0 in | Wt 178.0 lb

## 2023-11-19 DIAGNOSIS — M5416 Radiculopathy, lumbar region: Secondary | ICD-10-CM

## 2023-11-19 DIAGNOSIS — Z9889 Other specified postprocedural states: Secondary | ICD-10-CM

## 2023-11-19 NOTE — Progress Notes (Signed)
   REFERRING PHYSICIAN:  Allegra Grana, Fnp [address] [place],  [place] [address]  DOS: 10/14/23 right L4-5 microdiscectomy   HISTORY OF PRESENT ILLNESS: Regina Barnes is status post lumbar discectomy. she is doing well.  She has had only intermittent discomfort in her right leg since surgery.  She is very happy with her improvements.   PHYSICAL EXAMINATION:  General: Patient is well developed, well nourished, calm, collected, and in no apparent distress.   NEUROLOGICAL:  General: In no acute distress.   Awake, alert, oriented to person, place, and time.  Pupils equal round and reactive to light.  Facial tone is symmetric.  Tongue protrusion is midline.  There is no pronator drift.   Strength:            Side Iliopsoas Quads Hamstring PF DF EHL  R 5 5 5 5 5 5   L 5 5 5 5 5 5    Incision c/d/i   ROS (Neurologic):  Negative except as noted above  IMAGING: No interval imaging to review  ASSESSMENT/PLAN:  Regina Barnes is doing well after microdiscectomy.  We reviewed her activity limitations.  We will see her back in 6 weeks.  I am very pleased with her improvements.   Venetia Night MD Department of neurosurgery

## 2023-11-24 DIAGNOSIS — E1165 Type 2 diabetes mellitus with hyperglycemia: Secondary | ICD-10-CM | POA: Diagnosis not present

## 2023-11-24 DIAGNOSIS — E785 Hyperlipidemia, unspecified: Secondary | ICD-10-CM | POA: Diagnosis not present

## 2023-11-24 DIAGNOSIS — E1169 Type 2 diabetes mellitus with other specified complication: Secondary | ICD-10-CM | POA: Diagnosis not present

## 2023-11-25 ENCOUNTER — Ambulatory Visit (INDEPENDENT_AMBULATORY_CARE_PROVIDER_SITE_OTHER)

## 2023-11-25 ENCOUNTER — Ambulatory Visit: Payer: Self-pay

## 2023-11-25 VITALS — BP 120/80 | HR 79 | Temp 98.3°F | Ht 67.0 in | Wt 181.4 lb

## 2023-11-25 DIAGNOSIS — N3001 Acute cystitis with hematuria: Secondary | ICD-10-CM | POA: Insufficient documentation

## 2023-11-25 DIAGNOSIS — R300 Dysuria: Secondary | ICD-10-CM | POA: Diagnosis not present

## 2023-11-25 LAB — URINALYSIS, ROUTINE W REFLEX MICROSCOPIC
Bilirubin Urine: NEGATIVE
Ketones, ur: NEGATIVE
Nitrite: NEGATIVE
Specific Gravity, Urine: <=1.005 — AB (ref 1.000–1.030)
Urine Glucose: NEGATIVE
Urobilinogen, UA: 0.2 (ref 0.0–1.0)
pH: 6 (ref 5.0–8.0)

## 2023-11-25 LAB — POC URINALSYSI DIPSTICK (AUTOMATED)
Bilirubin, UA: NEGATIVE
Glucose, UA: NEGATIVE
Ketones, UA: NEGATIVE
Nitrite, UA: POSITIVE — AB
Protein, UA: POSITIVE — AB
Spec Grav, UA: <=1.005 — AB (ref 1.010–1.025)
Urobilinogen, UA: 0.2 U/dL
pH, UA: 5.5 (ref 5.0–8.0)

## 2023-11-25 MED ORDER — SULFAMETHOXAZOLE-TRIMETHOPRIM 800-160 MG PO TABS: 1.0000 | ORAL_TABLET | Freq: Two times a day (BID) | ORAL | 0 refills | Status: AC

## 2023-11-25 NOTE — Assessment & Plan Note (Signed)
 Component     Latest Ref Rng 11/25/2023  Color, UA yellow   Clarity, UA cloudy   Glucose     Negative  Negative   Bilirubin, UA negative   Ketones, UA negative   Specific Gravity, UA     1.010 - 1.025  <=1.005 !   RBC, UA moderate !   pH, UA     5.0 - 8.0  5.5   Protein,UA     Negative  Positive !   Urobilinogen, UA     0.2 or 1.0 E.U./dL 0.2   Nitrite, UA positive !   Leukocytes,UA     Negative  Small (1+) !     - Above urine dipstick reviewed. Given patient's symptoms and presence of nitrites, leukocyte esterase recommend starting treatment with TMP-SMX 160-800 mg, one tab, twice a day for 5 days.  Antibiotic s/e including GI upset, yeast infection discussed and recommend patient take probiotic or yogurt to help reduce s/e.  - Urine will be cultured and based upon sensitivity patient will be reached out if change in treatment is needed.

## 2023-11-25 NOTE — Telephone Encounter (Signed)
 Copied from CRM [id number]. Topic: Clinical - Red Word Triage >> Nov 25, 2023 10:53 AM Gurney Maxin H wrote: [facility] that prompted transfer to Nurse Triage: Possible UTI, burning, frequency and urgency to urinate  Chief Complaint: burning and frequency with urination and urgency Symptoms: see above Frequency: started 3 days ago Pertinent Negatives: Patient denies fever, flank pain, blood in urine Disposition: [] ED /[] Urgent Care (no appt availability in office) / [x] Appointment(In office/virtual)/ []  [facility]/ [] Home Care/ [] Refused Recommended Disposition /[] [facility]/ []  Follow-up with PCP Additional Notes: per protocol apt made for today; care advice given, denies questions; instructed to go to ER if becomes worse.   Reason for Disposition  Urinating more frequently than usual (i.e., frequency)  Answer Assessment - Initial Assessment Questions 1. SYMPTOM: "What's the main symptom you're concerned about?" (e.g., frequency, incontinence)     Burning, frequency and urgency 2. ONSET: "When did the  symptoms  start?"     3 days ago 3. PAIN: "Is there any pain?" If Yes, ask: "How bad is it?" (Scale: 1-10; mild, moderate, severe)     Just when she urinates 4. CAUSE: "What do you think is causing the symptoms?"     uti 5. OTHER SYMPTOMS: "Do you have any other symptoms?" (e.g., blood in urine, fever, flank pain, pain with urination)     Pain with urination 6. PREGNANCY: "Is there any chance you are pregnant?" "When was your last menstrual period?"     na  Protocols used: Urinary Symptoms-A-AH

## 2023-11-25 NOTE — Progress Notes (Signed)
 Acute Office Visit  Subjective:    Patient ID: Regina Barnes, female    DOB: 1965-10-20, 58 y.o.   MRN: [id number]  Chief Complaint  Patient presents with   Urinary Tract Infection   Patient is in today for following urinary concerns:   Urinary Tract Infection    Urinary urgency, burning, frequency for 4 days. She denies of fever, chills, lower back pain, change in appetite, vaginal discharge. Patient has tried Azo without significant symptoms improvement. Patient was treated for UTI about two years ago. She has a h/o type II DM, psoriatic arthritis. No h/o adverse reactions to antibiotics in the past.    ROS As per HPI    Objective:    BP 120/80   Pulse 79   Temp 98.3 F (36.8 C) (Oral)   Ht 5\' 7"  (1.702 m)   Wt 181 lb 6.4 oz (82.3 kg)   LMP  (LMP Unknown)   SpO2 98%   BMI 28.41 kg/m    Physical Exam HENT:     Head: Normocephalic.     Mouth/Throat:     Pharynx: No oropharyngeal exudate or posterior oropharyngeal erythema.  Cardiovascular:     Rate and Rhythm: Normal rate.  Abdominal:     General: Abdomen is flat. Bowel sounds are normal.     Palpations: Abdomen is soft.     Tenderness: There is no right CVA tenderness or left CVA tenderness.  Musculoskeletal:     Right lower leg: No edema.     Left lower leg: No edema.  Skin:    General: Skin is warm.  Neurological:     Mental Status: She is alert and oriented to person, place, and time.  Psychiatric:        Mood and Affect: Mood normal.     Results for orders placed or performed in visit on 11/25/23  POCT Urinalysis Dipstick (Automated)  Result Value Ref Range   Color, UA yellow    Clarity, UA cloudy    Glucose, UA Negative Negative   Bilirubin, UA negative    Ketones, UA negative    Spec Grav, UA <=1.005 (A) 1.010 - 1.025   Blood, UA moderate (A)    pH, UA 5.5 5.0 - 8.0   Protein, UA Positive (A) Negative   Urobilinogen, UA 0.2 0.2 or 1.0 E.U./dL   Nitrite, UA positive (A)    Leukocytes, UA  Small (1+) (A) Negative       Assessment & Plan:  Acute cystitis with hematuria Assessment & Plan: Component     Latest Ref Rng 11/25/2023  Color, UA yellow   Clarity, UA cloudy   Glucose     Negative  Negative   Bilirubin, UA negative   Ketones, UA negative   Specific Gravity, UA     1.010 - 1.025  <=1.005 !   RBC, UA moderate !   pH, UA     5.0 - 8.0  5.5   Protein,UA     Negative  Positive !   Urobilinogen, UA     0.2 or 1.0 E.U./dL 0.2   Nitrite, UA positive !   Leukocytes,UA     Negative  Small (1+) !     - Above urine dipstick reviewed. Given patient's symptoms and presence of nitrites, leukocyte esterase recommend starting treatment with TMP-SMX 160-800 mg, one tab, twice a day for 5 days.  Antibiotic s/e including GI upset, yeast infection discussed and recommend patient take probiotic or yogurt to  help reduce s/e.  - Urine will be cultured and based upon sensitivity patient will be reached out if change in treatment is needed.   Orders: -     POCT Urinalysis Dipstick (Automated) -     Urinalysis, Routine w reflex microscopic -     Urine Culture -     Sulfamethoxazole-Trimethoprim; Take 1 tablet by mouth 2 (two) times daily for 5 days.  Dispense: 10 tablet; Refill: 0    Return if symptoms worsen or fail to improve.  Skip Estimable, MD

## 2023-11-27 LAB — URINE CULTURE
MICRO NUMBER:: 16279651
SPECIMEN QUALITY:: ADEQUATE

## 2023-12-04 ENCOUNTER — Encounter: Payer: Self-pay | Admitting: Family

## 2024-01-05 ENCOUNTER — Ambulatory Visit (INDEPENDENT_AMBULATORY_CARE_PROVIDER_SITE_OTHER): Payer: 59 | Admitting: Neurosurgery

## 2024-01-05 VITALS — BP 118/78 | Temp 97.8°F | Ht 67.0 in | Wt 181.0 lb

## 2024-01-05 DIAGNOSIS — Z9889 Other specified postprocedural states: Secondary | ICD-10-CM

## 2024-01-05 DIAGNOSIS — Z09 Encounter for follow-up examination after completed treatment for conditions other than malignant neoplasm: Secondary | ICD-10-CM

## 2024-01-05 DIAGNOSIS — M5416 Radiculopathy, lumbar region: Secondary | ICD-10-CM

## 2024-01-05 NOTE — Progress Notes (Signed)
   REFERRING PHYSICIAN:  Calista Catching, Fnp [address] [place],  [place] [address]  DOS: 10/14/23 right L4-5 microdiscectomy   HISTORY OF PRESENT ILLNESS:  01/05/24 Regina Barnes is a 58 year old presenting today for 4-month follow-up of right L4-5 microdiscectomy.  She is doing very well with some this goes away throughout the day.Overall she is very pleased with her post-operative outcome.   11/19/23 Regina Barnes is status post lumbar discectomy. she is doing well.  She has had only intermittent discomfort in her right leg since surgery.  She is very happy with her improvements.   PHYSICAL EXAMINATION:  General: Patient is well developed, well nourished, calm, collected, and in no apparent distress.   NEUROLOGICAL:  General: In no acute distress.   Awake, alert, oriented to person, place, and time.  Pupils equal round and reactive to light.  Facial tone is symmetric.  Tongue protrusion is midline.  There is no pronator drift.   Strength:            Side Iliopsoas Quads Hamstring PF DF EHL  R 5 5 5 5 5 5   L 5 5 5 5 5 5    Incision well healed    ROS (Neurologic):  Negative except as noted above  IMAGING: No interval imaging to review  ASSESSMENT/PLAN:  Regina Barnes is doing well after microdiscectomy.  She is to resume activities as tolerated. We will see her going forward on an as needed basis. She was encouraged to call the office with any questions or concerns. She expressed understanding and was in agreement with this plan.   Anastacio Karvonen PA-C Department of neurosurgery

## 2024-01-21 DIAGNOSIS — L409 Psoriasis, unspecified: Secondary | ICD-10-CM | POA: Diagnosis not present

## 2024-01-21 DIAGNOSIS — Z7960 Long term (current) use of unspecified immunomodulators and immunosuppressants: Secondary | ICD-10-CM | POA: Diagnosis not present

## 2024-01-21 DIAGNOSIS — L4050 Arthropathic psoriasis, unspecified: Secondary | ICD-10-CM | POA: Diagnosis not present

## 2024-02-24 ENCOUNTER — Other Ambulatory Visit: Payer: Self-pay

## 2024-02-24 DIAGNOSIS — E119 Type 2 diabetes mellitus without complications: Secondary | ICD-10-CM

## 2024-02-24 LAB — HEMOGLOBIN A1C: Hemoglobin A1C: 7.4

## 2024-04-28 ENCOUNTER — Ambulatory Visit (INDEPENDENT_AMBULATORY_CARE_PROVIDER_SITE_OTHER): Admitting: Family

## 2024-04-28 ENCOUNTER — Encounter: Payer: Self-pay | Admitting: Family

## 2024-04-28 VITALS — BP 128/78 | HR 82 | Temp 98.7°F | Ht 67.0 in | Wt 175.6 lb

## 2024-04-28 DIAGNOSIS — Z7984 Long term (current) use of oral hypoglycemic drugs: Secondary | ICD-10-CM

## 2024-04-28 DIAGNOSIS — Z8249 Family history of ischemic heart disease and other diseases of the circulatory system: Secondary | ICD-10-CM | POA: Diagnosis not present

## 2024-04-28 DIAGNOSIS — E119 Type 2 diabetes mellitus without complications: Secondary | ICD-10-CM | POA: Diagnosis not present

## 2024-04-28 DIAGNOSIS — J0100 Acute maxillary sinusitis, unspecified: Secondary | ICD-10-CM | POA: Diagnosis not present

## 2024-04-28 DIAGNOSIS — Z1231 Encounter for screening mammogram for malignant neoplasm of breast: Secondary | ICD-10-CM | POA: Diagnosis not present

## 2024-04-28 LAB — MICROALBUMIN / CREATININE URINE RATIO
Creatinine,U: 92.3 mg/dL
Microalb Creat Ratio: UNDETERMINED mg/g (ref 0.0–30.0)
Microalb, Ur: <0.7 mg/dL

## 2024-04-28 LAB — HM DIABETES EYE EXAM

## 2024-04-28 NOTE — Assessment & Plan Note (Signed)
 Chronic, improved.  Continue metformin  as managed by endocrinology.

## 2024-04-28 NOTE — Progress Notes (Signed)
 Assessment & Plan:  Encounter for screening mammogram for malignant neoplasm of breast -     3D Screening Mammogram, Left and Right; Future  Type 2 diabetes mellitus without complication, without long-term current use of insulin  (HCC) Assessment & Plan: Chronic, improved.  Continue metformin  as managed by endocrinology.  Orders: -     Microalbumin / creatinine urine ratio  Acute non-recurrent maxillary sinusitis Assessment & Plan: Afebrile.  Reassuring exam.  Duration 5 days.  Discussed with patient more likely viral or allergic in etiology.  We agreed conservative management is most appropriate at this time.  Advised continued use of antihistamine over-the-counter, use of Mucinex  DM.  She will let me know if symptoms persist or symptoms were to worsen as at that point I recommended an antibiotic.   Family history of ASCVD Assessment & Plan: Discussed reassuring calcium  score of 10/2023.  Continue Crestor  10mg  for ASCVD prevention in the setting of diabetes.  LDL at goal < 70.      Return precautions given.   Risks, benefits, and alternatives of the medications and treatment plan prescribed today were discussed, and patient expressed understanding.   Education regarding symptom management and diagnosis given to patient on AVS either electronically or printed.  Return for Complete Physical Exam.  Rollene Northern, FNP  Subjective:    Patient ID: Regina Barnes, female    DOB: Sep 10, 1965, 58 y.o.   MRN: [id number]  CC: Regina Barnes is a 58 y.o. female who presents today for follow up.   HPI: Complains of dry cough with 'tickle' in throat x 5 days, unchanged. Started with sore throat, since resolved. Cough is throughout the day. Using throat lozenge with some relief.  She is taking OTC antihistamine 'allergy medicine' Negative covid at home x 2 No sick contacts.  Denies SOB, cp, wheezing, epigastric burning, N, v   Follow-up  endocrinology for diabetes 03/15/2024.  A1c  decreased 7.4.  No changes to medications. LDL 51 Creatinine 0.8 Allergies: Codeine Current Outpatient Medications on File Prior to Visit  Medication Sig Dispense Refill   B Complex-C (B-COMPLEX WITH VITAMIN C) tablet Take 1 tablet by mouth every other day. At night     Blood Glucose Monitoring Suppl (CONTOUR NEXT MONITOR) w/Device KIT 1 Device by Does not apply route daily as needed. 1 kit 0   Cholecalciferol (VITAMIN D ) 50 MCG (2000 UT) CAPS Take 2,000 Units by mouth in the morning and at bedtime.     Continuous Glucose Sensor (FREESTYLE LIBRE 3 SENSOR) MISC by Does not apply route.     glucose blood (CONTOUR NEXT TEST) test strip Used to check blood sugars one time a day. 100 each 4   ibuprofen (ADVIL) 200 MG tablet Take 400 mg by mouth every 8 (eight) hours as needed (pain.).     Lancets MISC Used to check blood sugars one time a day. DX E11.9. 100 each 4   MAGNESIUM GLYCINATE PO Take 200 mg by mouth at bedtime.     metFORMIN  (GLUCOPHAGE ) 1000 MG tablet Take 1 tablet (1,000 mg total) by mouth 2 (two) times daily with a meal. 180 tablet 3   rosuvastatin  (CRESTOR ) 10 MG tablet Take 1 tablet (10 mg total) by mouth daily. 90 tablet 3   traMADol (ULTRAM) 50 MG tablet Take 25-50 mg by mouth 2 (two) times daily as needed (pain.).     XELJANZ 5 MG TABS Take 5 mg by mouth 2 (two) times daily.     No  current facility-administered medications on file prior to visit.    Review of Systems  Constitutional:  Negative for chills and fever.  HENT:  Positive for congestion. Negative for ear pain, facial swelling and sore throat (resolved).   Respiratory:  Positive for cough. Negative for shortness of breath and wheezing.   Cardiovascular:  Negative for chest pain and palpitations.  Gastrointestinal:  Negative for nausea and vomiting.      Objective:    BP 128/78   Pulse 82   Temp 98.7 F (37.1 C) (Oral)   Ht 5' 7 (1.702 m)   Wt 175 lb 9.6 oz (79.7 kg)   LMP  (LMP Unknown)   SpO2 97%   BMI  27.50 kg/m  BP Readings from Last 3 Encounters:  04/28/24 128/78  01/05/24 118/78  11/25/23 120/80   Wt Readings from Last 3 Encounters:  04/28/24 175 lb 9.6 oz (79.7 kg)  01/05/24 181 lb (82.1 kg)  11/25/23 181 lb 6.4 oz (82.3 kg)    Physical Exam Vitals reviewed.  Constitutional:      Appearance: She is well-developed.  HENT:     Head: Normocephalic and atraumatic.     Right Ear: Hearing, tympanic membrane, ear canal and external ear normal. No decreased hearing noted. No drainage, swelling or tenderness. No middle ear effusion. No foreign body. Tympanic membrane is not erythematous or bulging.     Left Ear: Hearing, tympanic membrane, ear canal and external ear normal. No decreased hearing noted. No drainage, swelling or tenderness.  No middle ear effusion. No foreign body. Tympanic membrane is not erythematous or bulging.     Nose: Nose normal. No rhinorrhea.     Right Sinus: No maxillary sinus tenderness or frontal sinus tenderness.     Left Sinus: No maxillary sinus tenderness or frontal sinus tenderness.     Mouth/Throat:     Pharynx: Uvula midline. Posterior oropharyngeal erythema present. No oropharyngeal exudate.     Tonsils: No tonsillar abscesses.     Comments: Tonsils well behind pillars bilaterally Eyes:     Conjunctiva/sclera: Conjunctivae normal.  Cardiovascular:     Rate and Rhythm: Regular rhythm.     Pulses: Normal pulses.     Heart sounds: Normal heart sounds.  Pulmonary:     Effort: Pulmonary effort is normal.     Breath sounds: Normal breath sounds. No wheezing, rhonchi or rales.  Lymphadenopathy:     Head:     Right side of head: No submental, submandibular, tonsillar, preauricular, posterior auricular or occipital adenopathy.     Left side of head: No submental, submandibular, tonsillar, preauricular, posterior auricular or occipital adenopathy.     Cervical: No cervical adenopathy.  Skin:    General: Skin is warm and dry.  Neurological:     Mental  Status: She is alert.  Psychiatric:        Speech: Speech normal.        Behavior: Behavior normal.        Thought Content: Thought content normal.

## 2024-04-28 NOTE — Assessment & Plan Note (Addendum)
 Discussed reassuring calcium  score of 10/2023.  Continue Crestor  10mg  for ASCVD prevention in the setting of diabetes.  LDL at goal < 70.

## 2024-04-28 NOTE — Assessment & Plan Note (Signed)
 Afebrile.  Reassuring exam.  Duration 5 days.  Discussed with patient more likely viral or allergic in etiology.  We agreed conservative management is most appropriate at this time.  Advised continued use of antihistamine over-the-counter, use of Mucinex  DM.  She will let me know if symptoms persist or symptoms were to worsen as at that point I recommended an antibiotic.

## 2024-04-28 NOTE — Patient Instructions (Addendum)
 Please call  and schedule your 3D mammogram and /or bone density scan as we discussed.   [facility]  ( new location in 2023)  [address], [place], [place] [address]  [place], [place]  [phone]   Please start otc mucinex  DM, antihistamine.  Please let me know if cough or to worsen or not to resolve.

## 2024-04-29 ENCOUNTER — Ambulatory Visit: Payer: Self-pay | Admitting: Family

## 2024-05-25 ENCOUNTER — Other Ambulatory Visit: Payer: Self-pay | Admitting: Family

## 2024-05-25 DIAGNOSIS — E119 Type 2 diabetes mellitus without complications: Secondary | ICD-10-CM

## 2024-10-12 ENCOUNTER — Encounter

## 2025-05-02 ENCOUNTER — Encounter: Admitting: Family
